# Patient Record
Sex: Female | Born: 1945
Health system: Southern US, Community
[De-identification: ages and names within clinical notes are randomized; demographics above are authoritative.]

## PROBLEM LIST (undated history)

## (undated) DIAGNOSIS — F909 Attention-deficit hyperactivity disorder, unspecified type: Secondary | ICD-10-CM

## (undated) DIAGNOSIS — M810 Age-related osteoporosis without current pathological fracture: Secondary | ICD-10-CM

## (undated) DIAGNOSIS — E739 Lactose intolerance, unspecified: Secondary | ICD-10-CM

## (undated) DIAGNOSIS — E785 Hyperlipidemia, unspecified: Secondary | ICD-10-CM

## (undated) DIAGNOSIS — J45909 Unspecified asthma, uncomplicated: Secondary | ICD-10-CM

## (undated) DIAGNOSIS — F32A Depression, unspecified: Secondary | ICD-10-CM

## (undated) DIAGNOSIS — T7840XA Allergy, unspecified, initial encounter: Secondary | ICD-10-CM

## (undated) DIAGNOSIS — K219 Gastro-esophageal reflux disease without esophagitis: Secondary | ICD-10-CM

## (undated) DIAGNOSIS — L309 Dermatitis, unspecified: Secondary | ICD-10-CM

## (undated) DIAGNOSIS — Z1379 Encounter for other screening for genetic and chromosomal anomalies: Principal | ICD-10-CM

## (undated) DIAGNOSIS — F419 Anxiety disorder, unspecified: Secondary | ICD-10-CM

## (undated) DIAGNOSIS — Z1371 Encounter for nonprocreative screening for genetic disease carrier status: Secondary | ICD-10-CM

## (undated) DIAGNOSIS — Z8481 Family history of carrier of genetic disease: Secondary | ICD-10-CM

## (undated) HISTORY — DX: Attention-deficit hyperactivity disorder, unspecified type: F90.9

## (undated) HISTORY — PX: OTHER SURGICAL HISTORY: SHX169

## (undated) HISTORY — DX: Family history of carrier of genetic disease: Z84.81

## (undated) HISTORY — DX: Hyperlipidemia, unspecified: E78.5

## (undated) HISTORY — DX: Depression, unspecified: F32.A

## (undated) HISTORY — DX: Age-related osteoporosis without current pathological fracture: M81.0

## (undated) HISTORY — DX: Lactose intolerance, unspecified: E73.9

## (undated) HISTORY — DX: Gastro-esophageal reflux disease without esophagitis: K21.9

## (undated) HISTORY — PX: TUBAL LIGATION: SHX77

## (undated) HISTORY — DX: Allergy, unspecified, initial encounter: T78.40XA

## (undated) HISTORY — DX: Dermatitis, unspecified: L30.9

## (undated) HISTORY — DX: Encounter for other screening for genetic and chromosomal anomalies: Z13.79

## (undated) HISTORY — PX: WISDOM TOOTH EXTRACTION: SHX21

## (undated) HISTORY — DX: Unspecified asthma, uncomplicated: J45.909

## (undated) HISTORY — DX: Anxiety disorder, unspecified: F41.9

---

## 1997-12-15 ENCOUNTER — Other Ambulatory Visit: Admission: RE | Admit: 1997-12-15 | Discharge: 1997-12-15 | Payer: Self-pay | Admitting: *Deleted

## 1998-01-30 ENCOUNTER — Ambulatory Visit (HOSPITAL_COMMUNITY): Admission: RE | Admit: 1998-01-30 | Discharge: 1998-01-30 | Payer: Self-pay | Admitting: Obstetrics and Gynecology

## 1999-01-04 ENCOUNTER — Other Ambulatory Visit: Admission: RE | Admit: 1999-01-04 | Discharge: 1999-01-04 | Payer: Self-pay | Admitting: Obstetrics and Gynecology

## 1999-02-05 ENCOUNTER — Encounter: Payer: Self-pay | Admitting: Obstetrics and Gynecology

## 1999-02-05 ENCOUNTER — Ambulatory Visit (HOSPITAL_COMMUNITY): Admission: RE | Admit: 1999-02-05 | Discharge: 1999-02-05 | Payer: Self-pay | Admitting: Obstetrics and Gynecology

## 1999-06-11 DIAGNOSIS — A4902 Methicillin resistant Staphylococcus aureus infection, unspecified site: Secondary | ICD-10-CM

## 1999-06-11 HISTORY — DX: Methicillin resistant Staphylococcus aureus infection, unspecified site: A49.02

## 2000-08-21 ENCOUNTER — Other Ambulatory Visit: Admission: RE | Admit: 2000-08-21 | Discharge: 2000-08-21 | Payer: Self-pay | Admitting: Obstetrics and Gynecology

## 2001-09-17 ENCOUNTER — Other Ambulatory Visit: Admission: RE | Admit: 2001-09-17 | Discharge: 2001-09-17 | Payer: Self-pay | Admitting: Obstetrics and Gynecology

## 2002-11-23 ENCOUNTER — Other Ambulatory Visit: Admission: RE | Admit: 2002-11-23 | Discharge: 2002-11-23 | Payer: Self-pay | Admitting: Obstetrics and Gynecology

## 2003-12-22 ENCOUNTER — Other Ambulatory Visit: Admission: RE | Admit: 2003-12-22 | Discharge: 2003-12-22 | Payer: Self-pay | Admitting: Obstetrics and Gynecology

## 2005-02-06 ENCOUNTER — Other Ambulatory Visit: Admission: RE | Admit: 2005-02-06 | Discharge: 2005-02-06 | Payer: Self-pay | Admitting: Obstetrics and Gynecology

## 2012-01-12 ENCOUNTER — Ambulatory Visit (INDEPENDENT_AMBULATORY_CARE_PROVIDER_SITE_OTHER): Payer: BC Managed Care – PPO | Admitting: Family Medicine

## 2012-01-12 VITALS — BP 126/71 | HR 76 | Temp 97.9°F | Resp 18 | Ht 62.0 in | Wt 100.0 lb

## 2012-01-12 DIAGNOSIS — J329 Chronic sinusitis, unspecified: Secondary | ICD-10-CM

## 2012-01-12 MED ORDER — DOXYCYCLINE HYCLATE 100 MG PO TABS: 100.0000 mg | ORAL_TABLET | Freq: Two times a day (BID) | ORAL | Status: AC

## 2012-01-12 NOTE — Progress Notes (Signed)
[facility] [address] [place] [address] [phone]  Date:  01/12/2012   Name:  Alexis Phillips   DOB:  [birth year]   MRN:  [id number]  PCP:  No primary provider on file.    Chief Complaint: Sinusitis, Neck Pain and Headache   History of Present Illness:  Alexis Phillips is a 66 y.o. very pleasant female patient who presents with the following:  She is here today with illness.  She noted a "cold" and sinus congestion- she continues to have sinus pain and tooth pain.  Her illness started about a week ago.  She had sneezing, coughing, and now has mostly head and chest congestion.  She started to feel better yesterday, but now her neck and head hurt.  She just got a new job and is worried about being out due to illness.   She has noted a temp up to around 100 degrees.  Has noted a few chills, but no aches.  She has felt queasy due to drainage, but nothing serious.    She is generally healthy.    There is no problem list on file for this patient.   No past medical history on file.  No past surgical history on file.  History  Substance Use Topics  . Smoking status: Never Smoker   . Smokeless tobacco: Not on file  . Alcohol Use: Not on file    No family history on file.  Allergies not on file  Medication list has been reviewed and updated.  No current outpatient prescriptions on file prior to visit.    Review of Systems:  As per HPI- otherwise negative.   Physical Examination: Filed Vitals:   01/12/12 1751  BP: 126/71  Pulse: 76  Temp: 97.9 F (36.6 C)  Resp: 18   Filed Vitals:   01/12/12 1751  Height: 5\' 2"  (1.575 m)  Weight: 100 lb (45.36 kg)   Body mass index is 18.29 kg/(m^2). Ideal Body Weight: Weight in (lb) to have BMI = 25: 136.4   GEN: WDWN, NAD, Non-toxic, A & O x 3 HEENT: Atraumatic, Normocephalic. Neck supple. No masses, No LAD.  TM and oropharynx wnl.  Tender over frontal sinuses. No meningismus.  Tender shotty  nodes in her occipital area.   Ears and Nose: No external deformity. CV: RRR, No M/G/R. No JVD. No thrill. No extra heart sounds. PULM: CTA B, no wheezes, crackles, rhonchi. No retractions. No resp. distress. No accessory muscle use. ABD: S, NT, N EXTR: No c/c/e NEURO Normal gait.  PSYCH: Normally interactive. Conversant. Not depressed or anxious appearing.  Calm demeanor.    Assessment and Plan: 1. Sinusitis  doxycycline (VIBRA-TABS) 100 MG tablet   Treat for sinusitis as above.  If not better in a few days please let me know- Sooner if worse.     Abbe Amsterdam, MD

## 2012-01-13 ENCOUNTER — Telehealth: Payer: Self-pay

## 2012-01-13 NOTE — Telephone Encounter (Signed)
PT STATES THE DOXYCYCLINE SHE WAS PUT ON IS TO EXPENSIVE. WOULD LIKE TO KNOW IF WE WOULD CALL [facility] ON [address] AND SEE IF THEY OFFER THE $4.00 MEDICINE PLEASE CALL [phone]   [facility] [address]

## 2012-01-13 NOTE — Telephone Encounter (Signed)
She is at [facility], it is $52 she is advised [facility] another [facility] and see if there is any way this pharmacy has this Rx.

## 2012-03-20 ENCOUNTER — Ambulatory Visit (INDEPENDENT_AMBULATORY_CARE_PROVIDER_SITE_OTHER): Payer: BC Managed Care – PPO

## 2012-03-20 DIAGNOSIS — Z23 Encounter for immunization: Secondary | ICD-10-CM

## 2012-08-27 ENCOUNTER — Other Ambulatory Visit: Payer: Self-pay | Admitting: Obstetrics and Gynecology

## 2012-09-09 ENCOUNTER — Ambulatory Visit
Admission: RE | Admit: 2012-09-09 | Discharge: 2012-09-09 | Disposition: A | Discharge status: Home / Self Care | Payer: Medicare Other | Source: Ambulatory Visit | Attending: Obstetrics and Gynecology | Admitting: Obstetrics and Gynecology

## 2012-09-09 DIAGNOSIS — R928 Other abnormal and inconclusive findings on diagnostic imaging of breast: Secondary | ICD-10-CM

## 2013-03-22 ENCOUNTER — Ambulatory Visit (INDEPENDENT_AMBULATORY_CARE_PROVIDER_SITE_OTHER): Payer: Medicare Other | Admitting: *Deleted

## 2013-03-22 DIAGNOSIS — Z23 Encounter for immunization: Secondary | ICD-10-CM

## 2013-04-29 ENCOUNTER — Ambulatory Visit (INDEPENDENT_AMBULATORY_CARE_PROVIDER_SITE_OTHER): Payer: Medicare Other | Admitting: Internal Medicine

## 2013-04-29 ENCOUNTER — Encounter: Payer: Self-pay | Admitting: Internal Medicine

## 2013-04-29 VITALS — BP 110/70 | HR 72 | Temp 98.6°F | Resp 12

## 2013-04-29 DIAGNOSIS — S61209A Unspecified open wound of unspecified finger without damage to nail, initial encounter: Secondary | ICD-10-CM

## 2013-04-29 DIAGNOSIS — M79609 Pain in unspecified limb: Secondary | ICD-10-CM

## 2013-04-29 DIAGNOSIS — Z23 Encounter for immunization: Secondary | ICD-10-CM

## 2013-04-29 NOTE — Progress Notes (Signed)
  Subjective:    Patient ID: Alexis Phillips, female    DOB: Oct 22, 1945, 67 y.o.   MRN: [id number]  HPI At home and glass broke cutting distal dorsal right 5th finger.No function loss. Clean incision Needs Tdap update, has grand children.  Review of Systems     Objective:   Physical Exam  Constitutional: She is oriented to person, place, and time. She appears well-developed and well-nourished.  HENT:  Head: Normocephalic.  Eyes: EOM are normal.  Pulmonary/Chest: Effort normal.  Musculoskeletal: Normal range of motion. She exhibits tenderness.  Neurological: She is alert and oriented to person, place, and time. She has normal strength. No cranial nerve deficit or sensory deficit. She exhibits normal muscle tone. Coordination normal.  Skin: Laceration noted.     Right 5th dorsal wound Needs sutures  Psychiatric: She has a normal mood and affect.          Assessment & Plan:  Wound care Tdap vac

## 2013-04-29 NOTE — Patient Instructions (Signed)

## 2013-04-29 NOTE — Progress Notes (Signed)
Procedure Note: Verbal consent obtained from the patient.  Digital block with 4 cc Lidocaine 2% without epinephrine.  Wound scrubbed with soap and water.  Wound explored.  No foreign bodies or deep structure injury noted.  Wound closed with #4 simple interrupted sutures of 6-0 ethilon.  Area cleansed and dressed.  Discussed wound care. Pt tolerated very well.

## 2013-05-07 ENCOUNTER — Ambulatory Visit (INDEPENDENT_AMBULATORY_CARE_PROVIDER_SITE_OTHER): Payer: Medicare Other | Admitting: Family Medicine

## 2013-05-07 VITALS — BP 110/70 | HR 68 | Temp 98.0°F | Resp 16 | Wt 103.0 lb

## 2013-05-07 DIAGNOSIS — Z5189 Encounter for other specified aftercare: Secondary | ICD-10-CM

## 2013-05-07 DIAGNOSIS — S61401D Unspecified open wound of right hand, subsequent encounter: Secondary | ICD-10-CM

## 2013-05-07 MED ORDER — CEPHALEXIN 500 MG PO CAPS: 500.0000 mg | ORAL_CAPSULE | Freq: Three times a day (TID) | ORAL | Status: DC

## 2013-05-07 NOTE — Progress Notes (Signed)
Wound recheck  Subjective: Though it is still painful  Objective: 4 sutures removed. There is a little erythema and swelling.  Assessment: Wound of finger, sutures removed, possible infection  Plan: Keflex 500 3 times a day for 5 days Return if worse Keep clean and dry

## 2013-05-07 NOTE — Patient Instructions (Signed)
Take 13 times daily for infection  Keep wound clean and dry  Return if worse.

## 2013-11-16 LAB — HM MAMMOGRAPHY

## 2014-01-03 ENCOUNTER — Ambulatory Visit (INDEPENDENT_AMBULATORY_CARE_PROVIDER_SITE_OTHER): Payer: Medicare HMO | Admitting: Internal Medicine

## 2014-01-03 VITALS — BP 120/80 | HR 68 | Temp 97.8°F | Resp 16 | Ht 62.0 in | Wt 103.5 lb

## 2014-01-03 DIAGNOSIS — J4521 Mild intermittent asthma with (acute) exacerbation: Secondary | ICD-10-CM | POA: Insufficient documentation

## 2014-01-03 DIAGNOSIS — J45901 Unspecified asthma with (acute) exacerbation: Secondary | ICD-10-CM

## 2014-01-03 DIAGNOSIS — J301 Allergic rhinitis due to pollen: Secondary | ICD-10-CM

## 2014-01-03 MED ORDER — ALBUTEROL SULFATE HFA 108 (90 BASE) MCG/ACT IN AERS: 2.0000 | INHALATION_SPRAY | Freq: Four times a day (QID) | RESPIRATORY_TRACT | Status: AC | PRN

## 2014-01-03 MED ORDER — BECLOMETHASONE DIPROPIONATE 40 MCG/ACT IN AERS: 2.0000 | INHALATION_SPRAY | Freq: Two times a day (BID) | RESPIRATORY_TRACT | Status: DC

## 2014-01-03 NOTE — Progress Notes (Signed)
Subjective:  This chart was scribed for Alexis Koyanagi, MD by Alexis Phillips, ED Scribe. The patient was seen in room 3. Patient's care was started at 8:29 PM.   Patient ID: Alexis Phillips, female    DOB: [birth year], 68 y.o.   MRN: [id number]  Chief Complaint  Patient presents with  . Asthma  . Allergies   HPI HPI Comments: Alexis Phillips is a 68 y.o. female, with a h/o environmental allergies and asthma, who presents to the [facility] complaining of intermittent allergies, specifically chest tightness over the past few days. Pt states that once she steps outside she feels chest tightness. Pt reports associated HA, SOB, deep cough at night, postnasal drip, scratchy throat, raspy voice at night. Pt needs a refill on her inhaler. Pt has used an inhaler over the past 1-2 years. Pt has tried Advair in the past with hoarseness as a side effect. She has not tried Zyrtec or Allegra for allergies. Pt is a nonsmoker.   Healthy exerciser otherwise Alexis Phillips No past medical history on file. Current Outpatient Prescriptions on File Prior to Visit  Medication Sig Dispense Refill  . calcium-vitamin D (OSCAL WITH D) 250-125 MG-UNIT per tablet Take 1 tablet by mouth daily.      Marland Kitchen estradiol (ESTRACE) 0.1 MG/GM vaginal cream Place 2 g vaginally daily.       No current facility-administered medications on file prior to visit.   Allergies  Allergen Reactions  . Aspirin     Asthma exacerbation   . Augmentin [Amoxicillin-Pot Clavulanate]     Rash   . Boniva [Ibandronic Acid]     Bone and chest pain  . Codeine     Nausea   . Septra [Sulfamethoxazole-Trimethoprim]     rash   Review of Systems  Constitutional: Negative for fever, fatigue and unexpected weight change.  HENT: Positive for postnasal drip and voice change.   Eyes: Negative for discharge and itching.  Respiratory: Positive for cough, chest tightness and wheezing. Negative for shortness of  breath.   Cardiovascular: Negative for chest pain, palpitations and leg swelling.  Allergic/Immunologic: Positive for environmental allergies.  Neurological: Positive for headaches.      Objective:   Physical Exam  Constitutional: She is oriented to person, place, and time. She appears well-developed and well-nourished.  HENT:  Nose: Nose normal.  Mouth/Throat: Oropharynx is clear and moist.  Eyes: Conjunctivae and EOM are normal. Pupils are equal, round, and reactive to light.  Cardiovascular: Normal rate and regular rhythm.   Pulmonary/Chest: Effort normal and breath sounds normal. No respiratory distress. She has no wheezes.  She says wheez resolved in waiting room  Lymphadenopathy:    She has no cervical adenopathy.  Neurological: She is alert and oriented to person, place, and time.      Assessment & Plan:   I personally performed the services described in this documentation, which was scribed in my presence. The recorded information has been reviewed and is accurate.  Asthma with acute exacerbation, mild intermittent  Allergic rhinitis due to pollen  Meds ordered this encounter  Medications  . beclomethasone (QVAR) 40 MCG/ACT inhaler    Sig: Inhale 2 puffs into the lungs 2 (two) times daily. After 2 weeks may reduce to 1 puff bid    Dispense:  1 Inhaler    Refill:  12  . albuterol (PROVENTIL HFA;VENTOLIN HFA) 108 (90 BASE) MCG/ACT inhaler    Sig: Inhale 2  puffs into the lungs every 6 (six) hours as needed for wheezing or shortness of breath.    Dispense:  1 Inhaler    Refill:  5   OTC antihistamines Add singulair  If not controlled 4weeks

## 2014-03-11 ENCOUNTER — Ambulatory Visit (INDEPENDENT_AMBULATORY_CARE_PROVIDER_SITE_OTHER): Payer: Medicare HMO | Admitting: Radiology

## 2014-03-11 DIAGNOSIS — Z23 Encounter for immunization: Secondary | ICD-10-CM

## 2014-04-18 ENCOUNTER — Encounter: Payer: Self-pay | Admitting: Family Medicine

## 2014-04-18 ENCOUNTER — Ambulatory Visit (INDEPENDENT_AMBULATORY_CARE_PROVIDER_SITE_OTHER): Payer: Medicare HMO | Admitting: Family Medicine

## 2014-04-18 VITALS — BP 127/90 | HR 66 | Temp 97.4°F | Resp 16 | Ht 62.25 in | Wt 101.2 lb

## 2014-04-18 DIAGNOSIS — L259 Unspecified contact dermatitis, unspecified cause: Secondary | ICD-10-CM

## 2014-04-18 MED ORDER — HYDROCORTISONE 2.5 % EX OINT: TOPICAL_OINTMENT | Freq: Two times a day (BID) | CUTANEOUS | Status: DC

## 2014-04-18 MED ORDER — PREDNISONE 20 MG PO TABS: ORAL_TABLET | ORAL | Status: DC

## 2014-04-18 NOTE — Progress Notes (Signed)
Subjective:    Patient ID: Alexis Phillips, female    DOB: [birth year], 68 y.o.   MRN: [id number]  04/18/2014  Hives   HPI This 68 y.o. female presents for evaluation of itchy rash.  Onset several weeks ago.  Started on neck and around scalp and ears.  Extends to back and B arms.  +Scaling.  Worsens at night. Very dry skin chronic.   Itchy skin at baseline.  History of asthma, allergies, eczema.  Has been using scented body wash. Has also been using fragrant detergent to wash clothes.  Has changed shampoos as well lately. Has changed hairsprays hoping to improve rash without improvement.  No palm or sole involvement.  No wheezing; no SOB. No genital involvement or other mucosal involvement. No lower extremity involvement.  Has applied OTC hydrocortisone cream. History of hives several years ago.  Realizes that rash may be due to allergies.  No recent acute illness; no recent sore throat.  No new medications.  Has been taking Benadryl 25mg  two at bedtime.    Review of Systems  Constitutional: Negative for fever, chills, diaphoresis and fatigue.  HENT: Negative for congestion, ear pain, postnasal drip, rhinorrhea, sneezing and sore throat.   Respiratory: Negative for cough and shortness of breath.   Skin: Positive for color change and rash. Negative for pallor and wound.    History reviewed. No pertinent past medical history. History reviewed. No pertinent past surgical history. Allergies  Allergen Reactions  . Aspirin     Asthma exacerbation   . Augmentin [Amoxicillin-Pot Clavulanate]     Rash   . Boniva [Ibandronic Acid]     Bone and chest pain  . Codeine     Nausea   . Septra [Sulfamethoxazole-Trimethoprim]     rash   Current Outpatient Prescriptions  Medication Sig Dispense Refill  . albuterol (PROVENTIL HFA;VENTOLIN HFA) 108 (90 BASE) MCG/ACT inhaler Inhale 2 puffs into the lungs every 6 (six) hours as needed for wheezing or shortness of breath. 1 Inhaler 5  .  calcium-vitamin D (OSCAL WITH D) 250-125 MG-UNIT per tablet Take 1 tablet by mouth daily.    Marland Kitchen estradiol (ESTRACE) 0.1 MG/GM vaginal cream Place 2 g vaginally daily.    . beclomethasone (QVAR) 40 MCG/ACT inhaler Inhale 2 puffs into the lungs 2 (two) times daily. After 2 weeks may reduce to 1 puff bid 1 Inhaler 12  . hydrocortisone 2.5 % ointment Apply topically 2 (two) times daily. 60 g 4  . predniSONE (DELTASONE) 20 MG tablet Three tablets daily x 1 day, then two tablets daily x 5 days, then one tablet daily x 5 days 18 tablet 0   No current facility-administered medications for this visit.       Objective:    BP 127/90 mmHg  Pulse 66  Temp(Src) 97.4 F (36.3 C) (Oral)  Resp 16  Ht 5' 2.25" (1.581 m)  Wt 101 lb 3.2 oz (45.904 kg)  BMI 18.36 kg/m2  SpO2 97% Physical Exam  Constitutional: She is oriented to person, place, and time. She appears well-developed and well-nourished. No distress.  HENT:  Head: Normocephalic and atraumatic.  Mouth/Throat: Oropharynx is clear and moist.  Eyes: Conjunctivae are normal. Pupils are equal, round, and reactive to light.  Neck: Normal range of motion. Neck supple.  Cardiovascular: Normal rate, regular rhythm and normal heart sounds.  Exam reveals no gallop and no friction rub.   No murmur heard. Pulmonary/Chest: Effort normal and breath sounds normal. She has no  wheezes. She has no rales.  Lymphadenopathy:    She has no cervical adenopathy.  Neurological: She is alert and oriented to person, place, and time.  Skin: Rash noted. She is not diaphoretic.  Scattered maculopapular fine rash posterior scalp and posterior neck and upper back; extends to B arms.  No vesicles or pustules; no linear distribution.  Skin very dry.  Faint rash pre-auricular region facial.    Psychiatric: She has a normal mood and affect. Her behavior is normal.  Nursing note and vitals reviewed.  No results found for this or any previous visit.     Assessment & Plan:    1. Contact dermatitis      1. Contact dermatitis: New.  In patient with asthma, eczema.  Rx for Prednisone taper provided; recommend starting Claritin 10mg  daily; continue Benadryl 50mg  qhs. Rx for hydrocortisone cream 2.5% provided to use PRN.  Recommend switching to Dove soap for sensitive skin, detergent for sensitive skin.  RTC for lack of improvement.  Recommend regular lotion to skin for dryness.    Meds ordered this encounter  Medications  . predniSONE (DELTASONE) 20 MG tablet    Sig: Three tablets daily x 1 day, then two tablets daily x 5 days, then one tablet daily x 5 days    Dispense:  18 tablet    Refill:  0  . hydrocortisone 2.5 % ointment    Sig: Apply topically 2 (two) times daily.    Dispense:  60 g    Refill:  4    No Follow-up on file.    Reginia Forts, M.D.  [facility] [address] [place], [place]  [address] [phone] phone [phone] fax

## 2014-04-18 NOTE — Patient Instructions (Signed)
1. Take Benadryl two tablets at bedtime. 2.  Take Claritin 10mg  one tablet daily for two weeks. 3.  Switch to Dove soap for sensitive skin. 4. Switch to detergent for sensitive skin.

## 2014-04-19 ENCOUNTER — Encounter: Payer: Self-pay | Admitting: Family Medicine

## 2014-05-09 ENCOUNTER — Telehealth: Payer: Self-pay

## 2014-05-09 NOTE — Telephone Encounter (Signed)
Pt advised to RTC

## 2014-05-09 NOTE — Telephone Encounter (Signed)
Pt states that she was in on 04/18/14 for hives, the hives have returned pt would like to know if she should come in or if she could have a refill on prednisone.   [place]  Best# [phone]

## 2014-05-11 ENCOUNTER — Telehealth: Payer: Self-pay

## 2014-05-11 ENCOUNTER — Ambulatory Visit (INDEPENDENT_AMBULATORY_CARE_PROVIDER_SITE_OTHER): Payer: Medicare HMO | Admitting: Emergency Medicine

## 2014-05-11 VITALS — BP 118/64 | HR 67 | Temp 98.0°F | Resp 17 | Ht 63.0 in | Wt 104.0 lb

## 2014-05-11 DIAGNOSIS — L309 Dermatitis, unspecified: Secondary | ICD-10-CM

## 2014-05-11 MED ORDER — TRIAMCINOLONE ACETONIDE 0.5 % EX CREA: TOPICAL_CREAM | CUTANEOUS | Status: DC

## 2014-05-11 MED ORDER — TRIAMCINOLONE 0.1 % CREAM:EUCERIN CREAM 1:1: 1.0000 "application " | TOPICAL_CREAM | Freq: Three times a day (TID) | CUTANEOUS | Status: DC

## 2014-05-11 NOTE — Telephone Encounter (Signed)
1 lb jar prepared based on prescription, $94. Triamcinolone 0.1% cream, 60 g tube, is covered by her plan with a $10 copay.  Advise the patient I have sent in JUST the steroid cream. She needs to mix it herself with Eucerin cream and apply.  Meds ordered this encounter  Medications  . triamcinolone cream (KENALOG) 0.5 %    Sig: Mix a small amount in the palm with the moisturizer of choice (advise Eucerin)    Dispense:  60 g    Refill:  2    Order Specific Question:  Supervising Provider    Answer:  DOOLITTLE, ROBERT P [[id number]]

## 2014-05-11 NOTE — Progress Notes (Signed)
Subjective:  This chart was scribed for Alexis Lipps A. Everlene Farrier, MD by Alexis Phillips, Medical scribe. This patient was seen in ROOM 3 and the patient's care was started 8:15 AM.   Patient ID: Alexis Phillips, female    DOB: [birth year], 68 y.o.   MRN: [id number]   Chief Complaint  Patient presents with  . Rash    All over body of unknown origin    Rash Pertinent negatives include no shortness of breath.   HPI Comments: Alexis Phillips is a 68 y.o. female with a history of eczema and asthma who presents to [facility] complaining of an intermittent rash for the last 3 months. Pt says the rash starts on her neck and spreads to her back and bra line. She states that she has had bad seasonal allergies this year and states she is typically very sensitive to weather changes. She says the rash typically worsens as the days goes on. Pt says she has changed her shampoo and soap in attempts to resolve the rash. She states it is not present on her arms or legs. Pt states she currently takes Claritin, 2 benadryl before she goes to bed and hydrocortisone cream but the rash is still present. She denies any airway symptoms.    Patient Active Problem List   Diagnosis Date Noted  . Asthma with acute exacerbation 01/03/2014  . Allergic rhinitis due to pollen 01/03/2014   Past Medical History  Diagnosis Date  . Allergy   . Asthma   . Eczema    Current Outpatient Prescriptions on File Prior to Visit  Medication Sig Dispense Refill  . albuterol (PROVENTIL HFA;VENTOLIN HFA) 108 (90 BASE) MCG/ACT inhaler Inhale 2 puffs into the lungs every 6 (six) hours as needed for wheezing or shortness of breath. 1 Inhaler 5  . calcium-vitamin D (OSCAL WITH D) 250-125 MG-UNIT per tablet Take 1 tablet by mouth daily.    Marland Kitchen estradiol (ESTRACE) 0.1 MG/GM vaginal cream Place 2 g vaginally daily.    . hydrocortisone 2.5 % ointment Apply topically 2 (two) times daily. 60 g 4  . predniSONE (DELTASONE) 20 MG tablet Three tablets daily x 1  day, then two tablets daily x 5 days, then one tablet daily x 5 days (Patient not taking: Reported on 05/11/2014) 18 tablet 0   No current facility-administered medications on file prior to visit.   Allergies  Allergen Reactions  . Aspirin     Asthma exacerbation   . Augmentin [Amoxicillin-Pot Clavulanate]     Rash   . Boniva [Ibandronic Acid]     Bone and chest pain  . Codeine     Nausea   . Septra [Sulfamethoxazole-Trimethoprim]     rash   No past surgical history on file.  No family history on file.   Review of Systems  Respiratory: Negative for shortness of breath.   Skin: Positive for color change and rash.      Objective:  Physical Exam  Constitutional: She is oriented to person, place, and time. She appears well-developed and well-nourished.  HENT:  Head: Normocephalic and atraumatic.  Neck: Neck supple. No tracheal deviation present.  Cardiovascular: Normal rate, regular rhythm and normal heart sounds.   Pulmonary/Chest: Effort normal and breath sounds normal. No respiratory distress. She has no wheezes. She has no rales.  Abdominal: She exhibits no distension.  Neurological: She is alert and oriented to person, place, and time.  Skin: Skin is warm and dry.  Macular papular rash that involves  neck, back, and anterior areas beneath the breasts. Spotty areas on the abdomen.   Psychiatric: She has a normal mood and affect. Her behavior is normal.  Nursing note and vitals reviewed.   Filed Vitals:   05/11/14 0816  BP: 118/64  Pulse: 67  Temp: 98 F (36.7 C)  TempSrc: Oral  Resp: 17  Height: 5\' 3"  (1.6 m)  Weight: 104 lb (47.174 kg)  SpO2: 99%      Assessment & Plan:  . Referral has been made to dermatology. She will take  AM  Zyrtec in the morning and use triamcinolone Eucerin mix twice a day. I personally performed the services described in this documentation, which was scribed in my presence. The recorded information has been reviewed and is accurate.I  personally performed the services described in this documentation, which was scribed in my presence. The recorded information has been reviewed and is accurate.

## 2014-05-11 NOTE — Telephone Encounter (Signed)
PT states that the Triamcinolone Acetonide (TRIAMCINOLONE 0.1 % CREAM : EUCERIN) CREA [[id number]] that she was prescribed today was too expensive, she would like to know if there is something else cheaper she can use until she has er appt, with the dermatologist.

## 2014-05-11 NOTE — Progress Notes (Signed)
Subjective:  This chart was scribed for Alexis Phillips A. Alexis Farrier, MD by [name], Medical scribe. This patient was seen in ROOM 3 and the patient's care was started 8:15 AM.   Patient ID: Alexis Phillips, female    DOB: 1945-12-07, 68 y.o.   MRN: [id number]   Chief Complaint  Patient presents with   Rash    All over body of unknown origin    Rash Pertinent negatives include no shortness of breath.   HPI Comments: SYNA GAD is a 68 y.o. female with a history of eczema and asthma who presents to [facility] complaining of an intermittent rash for the last 3 months. Pt says the rash starts on her neck and spreads to her back and bra line. She states that she has had bad seasonal allergies this year and states she is typically very sensitive to weather changes. She says the rash typically worsens as the days goes on. Pt says she has changed her shampoo and soap in attempts to resolve the rash. She states it is not present on her arms or legs. Pt states she currently takes Claritin, 2 benadryl before she goes to bed and hydrocortisone cream but the rash is still present. She denies any airway symptoms.    Patient Active Problem List   Diagnosis Date Noted   Asthma with acute exacerbation 01/03/2014   Allergic rhinitis due to pollen 01/03/2014   Past Medical History  Diagnosis Date   Allergy    Asthma    Eczema    Current Outpatient Prescriptions on File Prior to Visit  Medication Sig Dispense Refill   albuterol (PROVENTIL HFA;VENTOLIN HFA) 108 (90 BASE) MCG/ACT inhaler Inhale 2 puffs into the lungs every 6 (six) hours as needed for wheezing or shortness of breath. 1 Inhaler 5   calcium-vitamin D (OSCAL WITH D) 250-125 MG-UNIT per tablet Take 1 tablet by mouth daily.     estradiol (ESTRACE) 0.1 MG/GM vaginal cream Place 2 g vaginally daily.     hydrocortisone 2.5 % ointment Apply topically 2 (two) times daily. 60 g 4   predniSONE (DELTASONE) 20 MG tablet Three tablets daily x 1  day, then two tablets daily x 5 days, then one tablet daily x 5 days (Patient not taking: Reported on 05/11/2014) 18 tablet 0   No current facility-administered medications on file prior to visit.   Allergies  Allergen Reactions   Aspirin     Asthma exacerbation    Augmentin [Amoxicillin-Pot Clavulanate]     Rash    Boniva [Ibandronic Acid]     Bone and chest pain   Codeine     Nausea    Septra [Sulfamethoxazole-Trimethoprim]     rash   No past surgical history on file.  No family history on file.   Review of Systems  Respiratory: Negative for shortness of breath.   Skin: Positive for color change and rash.      Objective:  Physical Exam  Constitutional: She is oriented to person, place, and time. She appears well-developed and well-nourished.  HENT:  Head: Normocephalic and atraumatic.  Neck: Neck supple. No tracheal deviation present.  Cardiovascular: Normal rate, regular rhythm and normal heart sounds.   Pulmonary/Chest: Effort normal and breath sounds normal. No respiratory distress. She has no wheezes. She has no rales.  Abdominal: She exhibits no distension.  Neurological: She is alert and oriented to person, place, and time.  Skin: Skin is warm and dry.  Macular papular rash that involves  neck, back, and anterior areas beneath the breasts. Spotty areas on the abdomen.   Psychiatric: She has a normal mood and affect. Her behavior is normal.  Nursing note and vitals reviewed.   Filed Vitals:   05/11/14 0816  BP: 118/64  Pulse: 67  Temp: 98 F (36.7 C)  TempSrc: Oral  Resp: 17  Height: 5\' 3"  (1.6 m)  Weight: 104 lb (47.174 kg)  SpO2: 99%      Assessment & Plan:  . Referral has been made to dermatology. She will take  AM  Zyrtec in the morning and use triamcinolone Eucerin mix twice a day. I personally performed the services described in this documentation, which was scribed in my presence. The recorded information has been reviewed and is  accurate.

## 2014-05-11 NOTE — Patient Instructions (Signed)
Eczema Eczema, also called atopic dermatitis, is a skin disorder that causes inflammation of the skin. It causes a red rash and dry, scaly skin. The skin becomes very itchy. Eczema is generally worse during the cooler winter months and often improves with the warmth of summer. Eczema usually starts showing signs in infancy. Some children outgrow eczema, but it may last through adulthood.  CAUSES  The exact cause of eczema is not known, but it appears to run in families. People with eczema often have a family history of eczema, allergies, asthma, or hay fever. Eczema is not contagious. Flare-ups of the condition may be caused by:   Contact with something you are sensitive or allergic to.   Stress. SIGNS AND SYMPTOMS  Dry, scaly skin.   Red, itchy rash.   Itchiness. This may occur before the skin rash and may be very intense.  DIAGNOSIS  The diagnosis of eczema is usually made based on symptoms and medical history. TREATMENT  Eczema cannot be cured, but symptoms usually can be controlled with treatment and other strategies. A treatment plan might include:  Controlling the itching and scratching.   Use over-the-counter antihistamines as directed for itching. This is especially useful at night when the itching tends to be worse.   Use over-the-counter steroid creams as directed for itching.   Avoid scratching. Scratching makes the rash and itching worse. It may also result in a skin infection (impetigo) due to a break in the skin caused by scratching.   Keeping the skin well moisturized with creams every day. This will seal in moisture and help prevent dryness. Lotions that contain alcohol and water should be avoided because they can dry the skin.   Limiting exposure to things that you are sensitive or allergic to (allergens).   Recognizing situations that cause stress.   Developing a plan to manage stress.  HOME CARE INSTRUCTIONS   Only take over-the-counter or  prescription medicines as directed by your health care provider.   Do not use anything on the skin without checking with your health care provider.   Keep baths or showers short (5 minutes) in warm (not hot) water. Use mild cleansers for bathing. These should be unscented. You may add nonperfumed bath oil to the bath water. It is best to avoid soap and bubble bath.   Immediately after a bath or shower, when the skin is still damp, apply a moisturizing ointment to the entire body. This ointment should be a petroleum ointment. This will seal in moisture and help prevent dryness. The thicker the ointment, the better. These should be unscented.   Keep fingernails cut short. Children with eczema may need to wear soft gloves or mittens at night after applying an ointment.   Dress in clothes made of cotton or cotton blends. Dress lightly, because heat increases itching.   A child with eczema should stay away from anyone with fever blisters or cold sores. The virus that causes fever blisters (herpes simplex) can cause a serious skin infection in children with eczema. SEEK MEDICAL CARE IF:   Your itching interferes with sleep.   Your rash gets worse or is not better within 1 week after starting treatment.   You see pus or soft yellow scabs in the rash area.   You have a fever.   You have a rash flare-up after contact with someone who has fever blisters.  Document Released: 05/24/2000 Document Revised: 03/17/2013 Document Reviewed: 12/28/2012 ExitCare Patient Information 2015 [employer]. This information   is not intended to replace advice given to you by your health care provider. Make sure you discuss any questions you have with your health care provider.  

## 2014-05-12 NOTE — Telephone Encounter (Signed)
Lm for pt advising alternative sent to the pharmacy. Rtn call if any questions.

## 2014-05-12 NOTE — Telephone Encounter (Signed)
Thank you Chelle

## 2014-09-23 ENCOUNTER — Encounter (HOSPITAL_COMMUNITY): Payer: Self-pay | Admitting: Emergency Medicine

## 2014-09-23 ENCOUNTER — Emergency Department (HOSPITAL_COMMUNITY)
Admission: EM | Admit: 2014-09-23 | Discharge: 2014-09-24 | Disposition: A | Discharge status: Home / Self Care | Payer: Medicare HMO | Attending: Emergency Medicine | Admitting: Emergency Medicine

## 2014-09-23 DIAGNOSIS — R109 Unspecified abdominal pain: Secondary | ICD-10-CM | POA: Diagnosis present

## 2014-09-23 DIAGNOSIS — Z872 Personal history of diseases of the skin and subcutaneous tissue: Secondary | ICD-10-CM | POA: Insufficient documentation

## 2014-09-23 DIAGNOSIS — R1084 Generalized abdominal pain: Secondary | ICD-10-CM | POA: Diagnosis not present

## 2014-09-23 DIAGNOSIS — Z7952 Long term (current) use of systemic steroids: Secondary | ICD-10-CM | POA: Insufficient documentation

## 2014-09-23 DIAGNOSIS — Z79899 Other long term (current) drug therapy: Secondary | ICD-10-CM | POA: Insufficient documentation

## 2014-09-23 DIAGNOSIS — J45909 Unspecified asthma, uncomplicated: Secondary | ICD-10-CM | POA: Insufficient documentation

## 2014-09-23 NOTE — ED Notes (Signed)
Bed: WA03 Expected date:  Expected time:  Means of arrival:  Comments: Triage 2

## 2014-09-23 NOTE — ED Notes (Signed)
Pt reports she began having epigastric pain since this morning. Pt states its a burning/aching pain that has gotten worse throughout the day.

## 2014-09-24 ENCOUNTER — Emergency Department (HOSPITAL_COMMUNITY): Payer: Medicare HMO

## 2014-09-24 LAB — CBC
HCT: 41.6 % (ref 36.0–46.0)
Hemoglobin: 13.7 g/dL (ref 12.0–15.0)
MCH: 32.1 pg (ref 26.0–34.0)
MCHC: 32.9 g/dL (ref 30.0–36.0)
MCV: 97.4 fL (ref 78.0–100.0)
PLATELETS: 229 10*3/uL (ref 150–400)
RBC: 4.27 MIL/uL (ref 3.87–5.11)
RDW: 12.9 % (ref 11.5–15.5)
WBC: 6.3 10*3/uL (ref 4.0–10.5)

## 2014-09-24 LAB — COMPREHENSIVE METABOLIC PANEL
ALT: 22 U/L (ref 0–35)
AST: 22 U/L (ref 0–37)
Albumin: 4.1 g/dL (ref 3.5–5.2)
Alkaline Phosphatase: 56 U/L (ref 39–117)
Anion gap: 4 — ABNORMAL LOW (ref 5–15)
BUN: 25 mg/dL — ABNORMAL HIGH (ref 6–23)
CHLORIDE: 106 mmol/L (ref 96–112)
CO2: 30 mmol/L (ref 19–32)
CREATININE: 0.58 mg/dL (ref 0.50–1.10)
Calcium: 8.9 mg/dL (ref 8.4–10.5)
GFR calc Af Amer: >90 mL/min (ref >90)
GFR calc non Af Amer: >90 mL/min (ref >90)
Glucose, Bld: 92 mg/dL (ref 70–99)
Potassium: 3.7 mmol/L (ref 3.5–5.1)
SODIUM: 140 mmol/L (ref 135–145)
TOTAL PROTEIN: 6.5 g/dL (ref 6.0–8.3)
Total Bilirubin: 0.5 mg/dL (ref 0.3–1.2)

## 2014-09-24 LAB — URINALYSIS, ROUTINE W REFLEX MICROSCOPIC
Bilirubin Urine: NEGATIVE
GLUCOSE, UA: NEGATIVE mg/dL
HGB URINE DIPSTICK: NEGATIVE
Ketones, ur: NEGATIVE mg/dL
Leukocytes, UA: NEGATIVE
NITRITE: NEGATIVE
Protein, ur: NEGATIVE mg/dL
Specific Gravity, Urine: 1.015 (ref 1.005–1.030)
Urobilinogen, UA: 0.2 mg/dL (ref 0.0–1.0)
pH: 6 (ref 5.0–8.0)

## 2014-09-24 LAB — LIPASE, BLOOD: LIPASE: 30 U/L (ref 11–59)

## 2014-09-24 MED ORDER — IOHEXOL 300 MG/ML  SOLN
100.0000 mL | Freq: Once | INTRAMUSCULAR | Status: AC | PRN
  Administered 2014-09-24: 100 mL via INTRAVENOUS

## 2014-09-24 MED ORDER — GI COCKTAIL ~~LOC~~
30.0000 mL | Freq: Once | ORAL | Status: AC
  Administered 2014-09-24: 30 mL via ORAL
  Filled 2014-09-24: qty 30

## 2014-09-24 MED ORDER — IOHEXOL 300 MG/ML  SOLN
50.0000 mL | Freq: Once | INTRAMUSCULAR | Status: AC | PRN
  Administered 2014-09-24: 50 mL via ORAL

## 2014-09-24 NOTE — ED Provider Notes (Signed)
CSN: [id number]     Arrival date & time 09/23/14  2153 History   First MD Initiated Contact with Patient 09/23/14 2356     Chief Complaint  Patient presents with  . Abdominal Pain     (Consider location/radiation/quality/duration/timing/severity/associated sxs/prior Treatment) Patient is a 69 y.o. female presenting with abdominal pain.  Abdominal Pain Pain location:  Generalized Pain quality: cramping   Pain radiates to:  Does not radiate Pain severity:  Moderate Onset quality:  Gradual Duration:  1 day Timing:  Constant Progression:  Unchanged Chronicity:  New Context comment:  On doxycycline, metrogel, hydroxyzine Relieved by:  Nothing Worsened by:  Movement and palpation Associated symptoms: no anorexia, no constipation, no diarrhea, no dysuria, no fever, no nausea, no shortness of breath, no vaginal bleeding, no vaginal discharge and no vomiting     Past Medical History  Diagnosis Date  . Allergy   . Asthma   . Eczema    History reviewed. No pertinent past surgical history. History reviewed. No pertinent family history. History  Substance Use Topics  . Smoking status: Never Smoker   . Smokeless tobacco: Never Used  . Alcohol Use: Yes     Comment: wine   OB History    No data available     Review of Systems  Constitutional: Negative for fever.  Respiratory: Negative for shortness of breath.   Gastrointestinal: Positive for abdominal pain. Negative for nausea, vomiting, diarrhea, constipation and anorexia.  Genitourinary: Negative for dysuria, vaginal bleeding and vaginal discharge.  All other systems reviewed and are negative.     Allergies  Aspirin; Augmentin; Boniva; Codeine; and Septra  Home Medications   Prior to Admission medications   Medication Sig Start Date End Date Taking? Authorizing Provider  doxycycline (VIBRA-TABS) 100 MG tablet Take 100 mg by mouth 2 (two) times daily.   Yes Historical Provider, MD  hydrOXYzine (ATARAX/VISTARIL) 25 MG  tablet Take 25 mg by mouth 3 (three) times daily as needed for itching.   Yes Historical Provider, MD  metroNIDAZOLE (METROGEL) 0.75 % gel Apply 1 application topically 2 (two) times daily.   Yes Historical Provider, MD  triamcinolone cream (KENALOG) 0.5 % Mix a small amount in the palm with the moisturizer of choice (advise Eucerin) 05/11/14  Yes Chelle S Jeffery, PA-C  albuterol (PROVENTIL HFA;VENTOLIN HFA) 108 (90 BASE) MCG/ACT inhaler Inhale 2 puffs into the lungs every 6 (six) hours as needed for wheezing or shortness of breath. 01/03/14   Leandrew Koyanagi, MD  calcium-vitamin D (OSCAL WITH D) 250-125 MG-UNIT per tablet Take 1 tablet by mouth daily.    Historical Provider, MD  estradiol (ESTRACE) 0.1 MG/GM vaginal cream Place 1 Applicatorful vaginally 3 (three) times a week.     Historical Provider, MD  hydrocortisone 2.5 % ointment Apply topically 2 (two) times daily. 04/18/14   Wardell Honour, MD  predniSONE (DELTASONE) 20 MG tablet Three tablets daily x 1 day, then two tablets daily x 5 days, then one tablet daily x 5 days Patient not taking: Reported on 05/11/2014 04/18/14   Wardell Honour, MD   BP 124/72 mmHg  Pulse 72  Temp(Src) 98.8 F (37.1 C) (Oral)  Resp 17  SpO2 98% Physical Exam  Constitutional: She is oriented to person, place, and time. She appears well-developed and well-nourished.  HENT:  Head: Normocephalic and atraumatic.  Right Ear: External ear normal.  Left Ear: External ear normal.  Eyes: Conjunctivae and EOM are normal. Pupils are equal, round, and  reactive to light.  Neck: Normal range of motion. Neck supple.  Cardiovascular: Normal rate, regular rhythm, normal heart sounds and intact distal pulses.   Pulmonary/Chest: Effort normal and breath sounds normal.  Abdominal: Soft. Bowel sounds are normal. There is tenderness in the left lower quadrant.  Musculoskeletal: Normal range of motion.  Neurological: She is alert and oriented to person, place, and time.  Skin:  Skin is warm and dry.  Vitals reviewed.   ED Course  Procedures (including critical care time) Labs Review Labs Reviewed  COMPREHENSIVE METABOLIC PANEL - Abnormal; Notable for the following:    BUN 25 (*)    Anion gap 4 (*)    All other components within normal limits  URINALYSIS, ROUTINE W REFLEX MICROSCOPIC  CBC  LIPASE, BLOOD    Imaging Review Ct Abdomen Pelvis W Contrast  09/24/2014   CLINICAL DATA:  Initial evaluation for increased acute epigastric pain.  EXAM: CT ABDOMEN AND PELVIS WITH CONTRAST  TECHNIQUE: Multidetector CT imaging of the abdomen and pelvis was performed using the standard protocol following bolus administration of intravenous contrast.  CONTRAST:  49mL OMNIPAQUE IOHEXOL 300 MG/ML SOLN, 179mL OMNIPAQUE IOHEXOL 300 MG/ML SOLN  COMPARISON:  None.  FINDINGS: The visualized lung bases are clear. Minimal subsegmental atelectasis noted dependently.  Subcentimeter hypodense lesion within the right hepatic lobe noted, too small the characterize, but may reflect a small cyst. Wedge-shaped hyperdense lesion within the subcapsular left hepatic lobe on early arterial phase face to isodense tibial on later delayed phase, likely focal AV shunt. Liver otherwise unremarkable. Gallbladder within normal limits. No biliary dilatation. Scattered hypodense lesions noted within the spleen on earlier phase MN which are not seen on delayed eminences, indeterminate, but of doubtful clinical significance. Adrenal glands and pancreas within normal limits.  Kidneys are equal in size with symmetric enhancement. No nephrolithiasis, hydronephrosis, or focal enhancing renal mass.  Stomach within normal limits. No evidence for bowel obstruction. No abnormal wall thickening, mucosal enhancement, or inflammatory fat stranding seen about the bowels. Appendix within normal limits.  Bladder within normal limits. Septate versus bicornuate uterus noted. Ovaries unremarkable.  No free air or fluid. No  pathologically enlarged intra-abdominal pelvic lymph nodes. Normal intravascular enhancement seen throughout the intra-abdominal aorta. No aneurysm.  No acute osseous abnormality. There is 3 mm anterolisthesis of L4 on L5. Advanced degenerative disc disease and facet arthrosis present at L4-5 and L5-S1. No worrisome lytic or blastic osseous lesion.  IMPRESSION: No acute intra-abdominal or pelvic process identified.   Electronically Signed   By: Jeannine Boga M.D.   On: 09/24/2014 02:40     EKG Interpretation   Date/Time:  Friday September 23 2014 22:34:00 EDT Ventricular Rate:  68 PR Interval:  179 QRS Duration: 69 QT Interval:  376 QTC Calculation: 400 R Axis:   72 Text Interpretation:  Sinus rhythm RSR' in V1 or V2, probably normal  variant Baseline wander in lead(s) II III aVF No old tracing to compare  Confirmed by Debby Freiberg [phone]) on 09/24/2014 12:28:01 AM      MDM   Final diagnoses:  Abdominal pain, acute    69 y.o. female with pertinent PMH of eczema, asthma presents with abd pain as above.  Exam with generalized abd tenderness, LLQ> elsewhere.  Wu as above unremarkable.  Likely medication reaction to steroid and abx.  Dc home in stable condition.  I have reviewed all laboratory and imaging studies if ordered as above  1. Abdominal pain, acute  Debby Freiberg, MD 09/24/14 [phone]

## 2014-09-24 NOTE — ED Notes (Signed)
Awake. Verbally responsive. Resp even and unlabored. No audible adventitious breath sounds noted. ABC's intact. Abd soft/nondistended but tender to palpate. BS (+) and active x4 quadrants. No N/V/D reported. 

## 2014-09-24 NOTE — Discharge Instructions (Signed)

## 2014-11-03 ENCOUNTER — Telehealth: Payer: Self-pay | Admitting: *Deleted

## 2014-11-03 NOTE — Telephone Encounter (Signed)
Phoned the [facility] and spoke with [facility] - has nothing on file any more recent than what we have on file.  States patient usually gets her mammo's through her OB/gyn TEFL [facility] [employer] [place]).  Is looking to see if she has any further data on file. 12/02/2013 mammo results are being faxed to me.  Will update health maintenance once received.

## 2014-11-09 NOTE — Telephone Encounter (Signed)
Called the [facility] for fax status and spoke with Shirlean Mylar in med rec.  Has no clue who I spoke with last week, but furthermore, has no recent documentation.  Phoned [facility] & spoke with Levada Dy in med rec & she is faxing the 12/02/13.  Will update health maintenance & gyn once received.

## 2014-11-09 NOTE — Telephone Encounter (Signed)
Mammogram documentation received from [facility]6/15/2015...no mammographic evidence of malignancy  Performed at [facility] by Dr. Luberta Robertson.  Health maintenance and care team updated & mammo abstracted as well.

## 2015-05-10 DIAGNOSIS — Z681 Body mass index (BMI) 19 or less, adult: Secondary | ICD-10-CM | POA: Diagnosis not present

## 2015-05-10 DIAGNOSIS — M810 Age-related osteoporosis without current pathological fracture: Secondary | ICD-10-CM | POA: Diagnosis not present

## 2015-05-10 DIAGNOSIS — Z1389 Encounter for screening for other disorder: Secondary | ICD-10-CM | POA: Diagnosis not present

## 2015-06-01 DIAGNOSIS — M810 Age-related osteoporosis without current pathological fracture: Secondary | ICD-10-CM | POA: Diagnosis not present

## 2015-06-01 DIAGNOSIS — Z0000 Encounter for general adult medical examination without abnormal findings: Secondary | ICD-10-CM | POA: Diagnosis not present

## 2015-06-08 DIAGNOSIS — Z681 Body mass index (BMI) 19 or less, adult: Secondary | ICD-10-CM | POA: Diagnosis not present

## 2015-06-08 DIAGNOSIS — Z23 Encounter for immunization: Secondary | ICD-10-CM | POA: Diagnosis not present

## 2015-06-08 DIAGNOSIS — Z0000 Encounter for general adult medical examination without abnormal findings: Secondary | ICD-10-CM | POA: Diagnosis not present

## 2015-11-21 DIAGNOSIS — J45909 Unspecified asthma, uncomplicated: Secondary | ICD-10-CM | POA: Diagnosis not present

## 2015-11-21 DIAGNOSIS — J309 Allergic rhinitis, unspecified: Secondary | ICD-10-CM | POA: Diagnosis not present

## 2016-01-17 DIAGNOSIS — Z681 Body mass index (BMI) 19 or less, adult: Secondary | ICD-10-CM | POA: Diagnosis not present

## 2016-01-17 DIAGNOSIS — Z1231 Encounter for screening mammogram for malignant neoplasm of breast: Secondary | ICD-10-CM | POA: Diagnosis not present

## 2016-01-17 DIAGNOSIS — Z124 Encounter for screening for malignant neoplasm of cervix: Secondary | ICD-10-CM | POA: Diagnosis not present

## 2016-01-17 DIAGNOSIS — Z1212 Encounter for screening for malignant neoplasm of rectum: Secondary | ICD-10-CM | POA: Diagnosis not present

## 2016-02-08 DIAGNOSIS — N958 Other specified menopausal and perimenopausal disorders: Secondary | ICD-10-CM | POA: Diagnosis not present

## 2016-02-08 DIAGNOSIS — M816 Localized osteoporosis [Lequesne]: Secondary | ICD-10-CM | POA: Diagnosis not present

## 2016-02-21 DIAGNOSIS — M810 Age-related osteoporosis without current pathological fracture: Secondary | ICD-10-CM | POA: Diagnosis not present

## 2016-02-21 DIAGNOSIS — Z1329 Encounter for screening for other suspected endocrine disorder: Secondary | ICD-10-CM | POA: Diagnosis not present

## 2016-02-21 DIAGNOSIS — M818 Other osteoporosis without current pathological fracture: Secondary | ICD-10-CM | POA: Diagnosis not present

## 2016-06-07 DIAGNOSIS — M810 Age-related osteoporosis without current pathological fracture: Secondary | ICD-10-CM | POA: Diagnosis not present

## 2016-06-07 DIAGNOSIS — Z0000 Encounter for general adult medical examination without abnormal findings: Secondary | ICD-10-CM | POA: Diagnosis not present

## 2016-06-13 DIAGNOSIS — Z681 Body mass index (BMI) 19 or less, adult: Secondary | ICD-10-CM | POA: Diagnosis not present

## 2016-06-13 DIAGNOSIS — Z23 Encounter for immunization: Secondary | ICD-10-CM | POA: Diagnosis not present

## 2016-06-13 DIAGNOSIS — J45909 Unspecified asthma, uncomplicated: Secondary | ICD-10-CM | POA: Diagnosis not present

## 2016-06-13 DIAGNOSIS — F419 Anxiety disorder, unspecified: Secondary | ICD-10-CM | POA: Diagnosis not present

## 2016-06-13 DIAGNOSIS — Z8481 Family history of carrier of genetic disease: Secondary | ICD-10-CM | POA: Diagnosis not present

## 2016-06-13 DIAGNOSIS — M810 Age-related osteoporosis without current pathological fracture: Secondary | ICD-10-CM | POA: Diagnosis not present

## 2016-06-13 DIAGNOSIS — Z0000 Encounter for general adult medical examination without abnormal findings: Secondary | ICD-10-CM | POA: Diagnosis not present

## 2016-06-14 ENCOUNTER — Encounter: Payer: Self-pay | Admitting: Gastroenterology

## 2016-07-04 ENCOUNTER — Encounter: Payer: Self-pay | Admitting: Genetic Counselor

## 2016-07-04 ENCOUNTER — Ambulatory Visit (HOSPITAL_BASED_OUTPATIENT_CLINIC_OR_DEPARTMENT_OTHER): Payer: Medicare HMO | Admitting: Genetic Counselor

## 2016-07-04 ENCOUNTER — Other Ambulatory Visit: Payer: Medicare HMO

## 2016-07-04 DIAGNOSIS — Z315 Encounter for genetic counseling: Secondary | ICD-10-CM

## 2016-07-04 DIAGNOSIS — Z853 Personal history of malignant neoplasm of breast: Secondary | ICD-10-CM | POA: Diagnosis not present

## 2016-07-04 DIAGNOSIS — Z8481 Family history of carrier of genetic disease: Secondary | ICD-10-CM | POA: Diagnosis not present

## 2016-07-04 DIAGNOSIS — Z809 Family history of malignant neoplasm, unspecified: Secondary | ICD-10-CM | POA: Diagnosis not present

## 2016-07-04 DIAGNOSIS — Z803 Family history of malignant neoplasm of breast: Secondary | ICD-10-CM

## 2016-07-04 NOTE — Progress Notes (Signed)
[facility]   Patient Name: Alexis Phillips Patient DOB: Apr 24, 1946 Encounter Date: 07/04/2016  Primary Care Provider: Velna Hatchet, MD  Reason for Visit: Evaluate for hereditary susceptibility to cancer  [name], a 71 y.o. female, is being seen at the [facility] due to a family history of cancer and a pathogenic BRCA1 mutation in her sister and niece. She presents to clinic today to discuss the possibility of a hereditary predisposition to cancer and pursue genetic testing.  History of Present Illness: Alexis Phillips has no personal history of cancer and is in generally good health. She reported having a yearly mammogram, clinical breast exam and gynecologic exam. Her first colonoscopy about 12 years ago was reportedly negative for polyps. She is scheduled for another next month.   Past Medical History:  Diagnosis Date  . Allergy   . Asthma   . Eczema   . Family history of genetic disease carrier    sister with pathogenic BRCA1 mutation    No past surgical history on file.  Social History   Social History  . Marital status: Legally Separated    Spouse name: N/A  . Number of children: N/A  . Years of education: N/A   Social History Main Topics  . Smoking status: Never Smoker  . Smokeless tobacco: Never Used  . Alcohol use Yes     Comment: wine  . Drug use: No  . Sexual activity: Not on file   Other Topics Concern  . Not on file   Social History Narrative  . No narrative on file     Family History:  During the visit, a 4-generation pedigree was obtained. Family tree will be scanned in the Media tab in Epic  Significant diagnoses include the following:  Family History  Problem Relation Age of Onset  . Prostate cancer Father 62    deceased 25  . Breast cancer Sister 79    BRCA1 mutation [id number]  . Cancer Brother     throat ca; deceased 65  . Cancer Paternal Uncle     3  pat uncles; unsure of types of cancers    Additionally, Alexis Phillips has two daughters (ages 67 and 27). Her sister who had breast cancer and this sisters daughter (age 37) both have a BRCA1 mutation ([id number]) identified through [employer]. She has another sister (age 80) and two living brothers (ages 74 and 35) who have not yet undergone genetic testing.  Alexis Phillips's ancestry is Caucasian - NOS. There is no known Jewish ancestry and no consanguinity.  Assessment and Plan: Alexis Phillips is a 71 y.o. female with a family history of breast and other cancers and a BRCA1 mutation in the family. Genetic testing is recommended to determine whether she has this BRCA1 mutation that would impact her cancer screening and risk-reduction options. We reviewed the characteristics, features and inheritance patterns of hereditary cancer syndromes with a focus on the BRCA1 gene. We discussed the process of genetic testing, including insurance coverage and implications of results: positive, negative and Variant of Uncertain Significance. She understood that she technically has a 50% chance of having inherited this mutation, but given that she is cancer-free at 71, it may be that she truly did not inherit it. A negative result will be very reassuring.   Alexis Phillips wished to pursue genetic testing and a blood sample will be sent for analysis of the 43  genes on [facility]'s Common Cancers panel (APC, ATM, AXIN2, BARD1, BMPR1A, BRCA1, BRCA2, BRIP1, CDH1, CDKN2A, CHEK2, DICER1, EPCAM, GREM1, HOXB13, KIT, MEN1, MLH1, MSH2, MSH6, MUTYH, NBN, NF1, PALB2, PDGFRA, PMS2, POLD1, POLE, PTEN, RAD50, RAD51C, RAD51D, SDHA, SDHB, SDHC, SDHD, SMAD4, SMARCA4, STK11, TP53, TSC1, TSC2, VHL). Results should be available in approximately 2-4 weeks, at which point we will contact her and address implications for her as well as address genetic testing for at-risk family members, if needed.    Alexis Phillips is encouraged to remain  in contact with Cancer Genetics annually so that we can update the family history and inform her of any changes in cancer genetics and testing that may be of benefit for this family. Alexis Phillips questions were answered to her satisfaction today and she is welcome to call with any additional questions or concerns. Thank you for the referral and allowing [place] to share in the care of your patient.   Dr. Jana Hakim was available for questions concerning this case. Total time spent by Alexis Berg, MS, CGC in face-to-face counseling was approximately 35 minutes.   Alexis Berg, MS, [place] Certified Genetic Counselor phone: [phone] [email or web address]   ______________________________________________________________________ For Office Staff:  Number of people involved in session: 1 Was an Intern/ student involved with case: no

## 2016-07-16 DIAGNOSIS — Z1212 Encounter for screening for malignant neoplasm of rectum: Secondary | ICD-10-CM | POA: Diagnosis not present

## 2016-07-24 ENCOUNTER — Encounter: Payer: Self-pay | Admitting: Genetic Counselor

## 2016-07-24 ENCOUNTER — Ambulatory Visit: Payer: Self-pay | Admitting: Genetic Counselor

## 2016-07-24 DIAGNOSIS — Z1379 Encounter for other screening for genetic and chromosomal anomalies: Secondary | ICD-10-CM

## 2016-07-24 HISTORY — DX: Encounter for other screening for genetic and chromosomal anomalies: Z13.79

## 2016-07-24 NOTE — Progress Notes (Signed)
[facility]    Patient Name: Alexis Phillips Patient DOB: 1945-10-16 Patient Age: 71 y.o. Encounter Date: 07/24/2016  Primary Care Provider: Velna Hatchet, MD   [name] was called today to discuss genetic test results. Please see the Genetics note from her visit on 07/04/16 for a detailed discussion of her personal and family history.  Genetic Testing: At the time of [name]'s visit, we recommended she pursue genetic testing of multiple associated with a hereditary predisposition to cancer. Testing included sequencing and deletion/duplication analysis. Testing was normal and did not reveal a mutation in these genes, including confirming that she does not have the familial BRCA1 mutation. A copy of the genetic test report will be scanned into Epic under the media tab.  We call this result a true negative result because the cancer-causing mutation was identified in [name]'s family, and she did not inherit it. Given this negative result, [name]'s chances of developing BRCA1-related cancers are the same as they are in the general population.    The genes tested were the 43 genes on [facility]'s Common Cancers panel (APC, ATM, AXIN2, BARD1, BMPR1A, BRCA1, BRCA2, BRIP1, CDH1, CDKN2A, CHEK2, DICER1, EPCAM, GREM1, HOXB13, KIT, MEN1, MLH1, MSH2, MSH6, MUTYH, NBN, NF1, PALB2, PDGFRA, PMS2, POLD1, POLE, PTEN, RAD50, RAD51C, RAD51D, SDHA, SDHB, SDHC, SDHD, SMAD4, SMARCA4, STK11, TP53, TSC1, TSC2, VHL).   Cancer Screening: This normal result is reassuring and indicates that [name] does not likely have an increased risk of cancer due to a mutation in one of these genes and does not have an increased risk of BRCA1-related cancers. We recommended [name] continue to follow the cancer screening guidelines provided by her primary physician.   We discussed undergoing breast screenings that are recommended by the [place] for women in the general population, but that may need to be modified based on other risk factors such as dense breasts, biopsy history or family history.  Breast awareness - Women should be familiar with their breasts and promptly report changes to their healthcare provider.   Starting at age 69: Breast exam, risk assessment, and risk reduction counseling by the provider and mammogram every year. The provider may discuss screening with tomosynthesis.  [name] is also recommended to undergo a yearly gynecologic exam and speak with her GI doctor as to when to have her next colonoscopy.   Family Members: Even though [name] did not inherit the familial gene mutation, others in the family may have. It is very important all of [name]'s relatives (female and female) know of the presence of this mutation and that they may be at increased risk for cancer. Genetic testing can sort out who in your family is at risk and who is not.  Please let us know if we can help facilitate testing. Genetic counselors can be located in other cities, by visiting the website of the [employer] (ArtistMovie.se) and Field seismologist for a Dietitian by zip code.  Lastly, cancer genetics is a rapidly advancing field and it is possible that new genetic tests will be appropriate for her in the future. We encourage her to remain in contact with [place] on an annual basis so we can update her personal and family histories, and let her know of advances in cancer genetics that may benefit the family. Our contact number was provided. [name] is welcome to call anytime with  additional questions.    Steele Berg, MS, [place] Certified Genetic Counselor phone: [phone] [email or web address]

## 2016-07-30 ENCOUNTER — Encounter: Payer: Self-pay | Admitting: Gastroenterology

## 2016-07-30 ENCOUNTER — Ambulatory Visit (AMBULATORY_SURGERY_CENTER): Payer: Self-pay

## 2016-07-30 VITALS — Ht 62.0 in | Wt 102.0 lb

## 2016-07-30 DIAGNOSIS — Z1211 Encounter for screening for malignant neoplasm of colon: Secondary | ICD-10-CM

## 2016-07-30 MED ORDER — SUPREP BOWEL PREP KIT 17.5-3.13-1.6 GM/177ML PO SOLN: 1.0000 | Freq: Once | ORAL | 0 refills | Status: AC

## 2016-07-30 NOTE — Progress Notes (Signed)
No allergies to eggs or soy No past problems with anesthesia No diet med No home oxygen  Declined emmi 

## 2016-08-12 ENCOUNTER — Telehealth: Payer: Self-pay | Admitting: Gastroenterology

## 2016-08-12 NOTE — Telephone Encounter (Signed)
Patient is concerned about her asthma and states that due to the weather change.  She states that it was a little worse yesterday and today is somewhat better.  I advised her to use her inhaler as prescribed and if her symptoms worsen to the point she has to seek medical care, she is to call us.  She will come in as scheduled otherwise and will bring her inhaler with her.  I also told her that we will monitor her oxygen level here and give her oxygen during the procedure.  I will note this on her chart so it will be visible to the CRNA, tech, and doctor.  All questions were answered.

## 2016-08-13 ENCOUNTER — Ambulatory Visit (AMBULATORY_SURGERY_CENTER): Payer: Medicare HMO | Admitting: Gastroenterology

## 2016-08-13 ENCOUNTER — Encounter: Payer: Self-pay | Admitting: Gastroenterology

## 2016-08-13 VITALS — BP 122/67 | HR 70 | Temp 98.6°F | Resp 13 | Ht 62.0 in | Wt 102.0 lb

## 2016-08-13 DIAGNOSIS — Z1211 Encounter for screening for malignant neoplasm of colon: Secondary | ICD-10-CM | POA: Diagnosis present

## 2016-08-13 DIAGNOSIS — D123 Benign neoplasm of transverse colon: Secondary | ICD-10-CM | POA: Diagnosis not present

## 2016-08-13 DIAGNOSIS — Z1212 Encounter for screening for malignant neoplasm of rectum: Secondary | ICD-10-CM

## 2016-08-13 DIAGNOSIS — J45909 Unspecified asthma, uncomplicated: Secondary | ICD-10-CM | POA: Diagnosis not present

## 2016-08-13 MED ORDER — SODIUM CHLORIDE 0.9 % IV SOLN: 500.0000 mL | INTRAVENOUS | Status: DC

## 2016-08-13 NOTE — Progress Notes (Signed)
Spontaneous respirations throughout. VSS. Resting comfortably. To PACU on room air. Report to  Sara RN. 

## 2016-08-13 NOTE — Progress Notes (Signed)
Pt has inhaler with her name at the bedside. maw

## 2016-08-13 NOTE — Progress Notes (Signed)
Pt's states no medical or surgical changes since previsit or office visit. maw 

## 2016-08-13 NOTE — Op Note (Signed)
[facility] Patient Name: Tiaa Kiah Procedure Date: 08/13/2016 8:48 AM MRN: [id number] Endoscopist: Ladene Artist , MD Age: 71 Referring MD:  Date of Birth: 1946-02-06 Gender: Female Account #: [id number] Procedure:                Colonoscopy Indications:              Screening for colorectal malignant neoplasm Medicines:                Monitored Anesthesia Care Procedure:                Pre-Anesthesia Assessment:                           - Prior to the procedure, a History and Physical                            was performed, and patient medications and                            allergies were reviewed. The patient's tolerance of                            previous anesthesia was also reviewed. The risks                            and benefits of the procedure and the sedation                            options and risks were discussed with the patient.                            All questions were answered, and informed consent                            was obtained. Prior Anticoagulants: The patient has                            taken no previous anticoagulant or antiplatelet                            agents. ASA Grade Assessment: II - A patient with                            mild systemic disease. After reviewing the risks                            and benefits, the patient was deemed in                            satisfactory condition to undergo the procedure.                           After obtaining informed consent, the colonoscope  was passed under direct vision. Throughout the                            procedure, the patient's blood pressure, pulse, and                            oxygen saturations were monitored continuously. The                            Colonoscope was introduced through the anus and                            advanced to the the cecum, identified by                            appendiceal orifice and  ileocecal valve. The                            ileocecal valve, appendiceal orifice, and rectum                            were photographed. The quality of the bowel                            preparation was good. The colonoscopy was performed                            without difficulty. The patient tolerated the                            procedure well. Scope In: 8:53:39 AM Scope Out: 9:05:09 AM Scope Withdrawal Time: 0 hours 9 minutes 6 seconds  Total Procedure Duration: 0 hours 11 minutes 30 seconds  Findings:                 The perianal and digital rectal examinations were                            normal.                           A 4 mm polyp was found in the transverse colon. The                            polyp was sessile. The polyp was removed with a                            cold forceps. Resection and retrieval were complete.                           Internal hemorrhoids were found during                            retroflexion. The hemorrhoids were small and Grade  I (internal hemorrhoids that do not prolapse).                           Multiple medium-mouthed diverticula were found in                            the sigmoid colon.                           The exam was otherwise without abnormality on                            direct and retroflexion views. Complications:            No immediate complications. Estimated blood loss:                            None. Estimated Blood Loss:     Estimated blood loss: none. Impression:               - One 4 mm polyp in the transverse colon, removed                            with a cold snare. Resected and retrieved.                           - Internal hemorrhoids.                           - Diverticulosis in the sigmoid colon.                           - The examination was otherwise normal on direct                            and retroflexion views. Recommendation:           - Repeat  colonoscopy in 5 years for surveillance if                            polyp is precancerous, otherwise 10 years for                            screening.                           - Patient has a contact number available for                            emergencies. The signs and symptoms of potential                            delayed complications were discussed with the                            patient. Return to normal activities tomorrow.  Written discharge instructions were provided to the                            patient.                           - High fiber diet.                           - Continue present medications.                           - Await pathology results. Ladene Artist, MD 08/13/2016 9:08:10 AM This report has been signed electronically.

## 2016-08-13 NOTE — Patient Instructions (Signed)
YOU HAD AN ENDOSCOPIC PROCEDURE TODAY AT [facility]:   Refer to the procedure report that was given to you for any specific questions about what was found during the examination.  If the procedure report does not answer your questions, please call your gastroenterologist to clarify.  If you requested that your care partner not be given the details of your procedure findings, then the procedure report has been included in a sealed envelope for you to review at your convenience later.  YOU SHOULD EXPECT: Some feelings of bloating in the abdomen. Passage of more gas than usual.  Walking can help get rid of the air that was put into your GI tract during the procedure and reduce the bloating. If you had a lower endoscopy (such as a colonoscopy or flexible sigmoidoscopy) you may notice spotting of blood in your stool or on the toilet paper. If you underwent a bowel prep for your procedure, you may not have a normal bowel movement for a few days.  Please Note:  You might notice some irritation and congestion in your nose or some drainage.  This is from the oxygen used during your procedure.  There is no need for concern and it should clear up in a day or so.  SYMPTOMS TO REPORT IMMEDIATELY:   Following lower endoscopy (colonoscopy or flexible sigmoidoscopy):  Excessive amounts of blood in the stool  Significant tenderness or worsening of abdominal pains  Swelling of the abdomen that is new, acute  Fever of 100F or higher    For urgent or emergent issues, a gastroenterologist can be reached at any hour by calling [phone].   DIET:  We do recommend a small meal at first, but then you may proceed to your regular diet.  Drink plenty of fluids but you should avoid alcoholic beverages for 24 hours.  ACTIVITY:  You should plan to take it easy for the rest of today and you should NOT DRIVE or use heavy machinery until tomorrow (because of the sedation medicines used during the test).     FOLLOW UP: Our staff will call the number listed on your records the next business day following your procedure to check on you and address any questions or concerns that you may have regarding the information given to you following your procedure. If we do not reach you, we will leave a message.  However, if you are feeling well and you are not experiencing any problems, there is no need to return our call.  We will assume that you have returned to your regular daily activities without incident.  If any biopsies were taken you will be contacted by phone or by letter within the next 1-3 weeks.  Please call us at [phone] if you have not heard about the biopsies in 3 weeks.   Thank you for allowing [place] to provide for your healthcare needs today.   SIGNATURES/CONFIDENTIALITY: You and/or your care partner have signed paperwork which will be entered into your electronic medical record.  These signatures attest to the fact that that the information above on your After Visit Summary has been reviewed and is understood.  Full responsibility of the confidentiality of this discharge information lies with you and/or your care-partner.   Please see all handouts given to you by your recovery nurse today to include polyps, diverticulosis, high-fiber diet, and hemorrhoids.

## 2016-08-13 NOTE — Progress Notes (Signed)
Called to room to assist during endoscopic procedure.  Patient ID and intended procedure confirmed with present staff. Received instructions for my participation in the procedure from the performing physician.  

## 2016-08-14 ENCOUNTER — Telehealth: Payer: Self-pay

## 2016-08-14 NOTE — Telephone Encounter (Signed)
  Follow up Call-  Call back number 08/13/2016  Post procedure Call Back phone  # [phone] cell  Permission to leave phone message Yes  Some recent data might be hidden     Patient questions:  Do you have a fever, pain , or abdominal swelling? No. Pain Score  0 *  Have you tolerated food without any problems? Yes.    Have you been able to return to your normal activities? Yes.    Do you have any questions about your discharge instructions: Diet   No. Medications  No. Follow up visit  No.  Do you have questions or concerns about your Care? No.  Actions: * If pain score is 4 or above: No action needed, pain <4.

## 2016-08-23 ENCOUNTER — Encounter: Payer: Self-pay | Admitting: Gastroenterology

## 2016-09-16 DIAGNOSIS — F418 Other specified anxiety disorders: Secondary | ICD-10-CM | POA: Diagnosis not present

## 2016-09-16 DIAGNOSIS — R490 Dysphonia: Secondary | ICD-10-CM | POA: Diagnosis not present

## 2016-09-16 DIAGNOSIS — Z681 Body mass index (BMI) 19 or less, adult: Secondary | ICD-10-CM | POA: Diagnosis not present

## 2016-09-17 DIAGNOSIS — F418 Other specified anxiety disorders: Secondary | ICD-10-CM | POA: Diagnosis not present

## 2016-10-04 DIAGNOSIS — F418 Other specified anxiety disorders: Secondary | ICD-10-CM | POA: Diagnosis not present

## 2016-10-21 DIAGNOSIS — F419 Anxiety disorder, unspecified: Secondary | ICD-10-CM | POA: Diagnosis not present

## 2016-11-12 DIAGNOSIS — F419 Anxiety disorder, unspecified: Secondary | ICD-10-CM | POA: Diagnosis not present

## 2017-01-10 DIAGNOSIS — L718 Other rosacea: Secondary | ICD-10-CM | POA: Diagnosis not present

## 2017-01-10 DIAGNOSIS — L821 Other seborrheic keratosis: Secondary | ICD-10-CM | POA: Diagnosis not present

## 2017-01-10 DIAGNOSIS — D225 Melanocytic nevi of trunk: Secondary | ICD-10-CM | POA: Diagnosis not present

## 2017-01-10 DIAGNOSIS — D1801 Hemangioma of skin and subcutaneous tissue: Secondary | ICD-10-CM | POA: Diagnosis not present

## 2017-01-31 ENCOUNTER — Encounter (HOSPITAL_COMMUNITY): Payer: Self-pay

## 2017-02-11 DIAGNOSIS — H524 Presbyopia: Secondary | ICD-10-CM | POA: Diagnosis not present

## 2017-02-11 DIAGNOSIS — H2513 Age-related nuclear cataract, bilateral: Secondary | ICD-10-CM | POA: Diagnosis not present

## 2017-02-11 DIAGNOSIS — H04123 Dry eye syndrome of bilateral lacrimal glands: Secondary | ICD-10-CM | POA: Diagnosis not present

## 2017-06-09 DIAGNOSIS — R82998 Other abnormal findings in urine: Secondary | ICD-10-CM | POA: Diagnosis not present

## 2017-06-09 DIAGNOSIS — Z79899 Other long term (current) drug therapy: Secondary | ICD-10-CM | POA: Diagnosis not present

## 2017-06-09 DIAGNOSIS — M810 Age-related osteoporosis without current pathological fracture: Secondary | ICD-10-CM | POA: Diagnosis not present

## 2017-06-16 DIAGNOSIS — Z681 Body mass index (BMI) 19 or less, adult: Secondary | ICD-10-CM | POA: Diagnosis not present

## 2017-06-16 DIAGNOSIS — M25511 Pain in right shoulder: Secondary | ICD-10-CM | POA: Diagnosis not present

## 2017-06-16 DIAGNOSIS — Z8481 Family history of carrier of genetic disease: Secondary | ICD-10-CM | POA: Diagnosis not present

## 2017-06-16 DIAGNOSIS — Z1389 Encounter for screening for other disorder: Secondary | ICD-10-CM | POA: Diagnosis not present

## 2017-06-16 DIAGNOSIS — Z0000 Encounter for general adult medical examination without abnormal findings: Secondary | ICD-10-CM | POA: Diagnosis not present

## 2017-06-16 DIAGNOSIS — F418 Other specified anxiety disorders: Secondary | ICD-10-CM | POA: Diagnosis not present

## 2017-06-16 DIAGNOSIS — M810 Age-related osteoporosis without current pathological fracture: Secondary | ICD-10-CM | POA: Diagnosis not present

## 2017-06-19 ENCOUNTER — Other Ambulatory Visit: Payer: Self-pay | Admitting: Internal Medicine

## 2017-06-19 DIAGNOSIS — Z1231 Encounter for screening mammogram for malignant neoplasm of breast: Secondary | ICD-10-CM

## 2017-06-27 DIAGNOSIS — M7712 Lateral epicondylitis, left elbow: Secondary | ICD-10-CM | POA: Diagnosis not present

## 2017-06-27 DIAGNOSIS — M67911 Unspecified disorder of synovium and tendon, right shoulder: Secondary | ICD-10-CM | POA: Diagnosis not present

## 2017-07-01 DIAGNOSIS — M7541 Impingement syndrome of right shoulder: Secondary | ICD-10-CM | POA: Diagnosis not present

## 2017-07-07 DIAGNOSIS — M7541 Impingement syndrome of right shoulder: Secondary | ICD-10-CM | POA: Diagnosis not present

## 2017-07-09 DIAGNOSIS — M7541 Impingement syndrome of right shoulder: Secondary | ICD-10-CM | POA: Diagnosis not present

## 2017-07-16 DIAGNOSIS — M7541 Impingement syndrome of right shoulder: Secondary | ICD-10-CM | POA: Diagnosis not present

## 2017-08-06 ENCOUNTER — Ambulatory Visit
Admission: RE | Admit: 2017-08-06 | Discharge: 2017-08-06 | Disposition: A | Discharge status: Home / Self Care | Payer: Medicare HMO | Source: Ambulatory Visit | Attending: Internal Medicine | Admitting: Internal Medicine

## 2017-08-06 DIAGNOSIS — Z1231 Encounter for screening mammogram for malignant neoplasm of breast: Secondary | ICD-10-CM

## 2017-08-27 DIAGNOSIS — Z681 Body mass index (BMI) 19 or less, adult: Secondary | ICD-10-CM | POA: Diagnosis not present

## 2017-08-27 DIAGNOSIS — F418 Other specified anxiety disorders: Secondary | ICD-10-CM | POA: Diagnosis not present

## 2017-08-27 DIAGNOSIS — H10403 Unspecified chronic conjunctivitis, bilateral: Secondary | ICD-10-CM | POA: Diagnosis not present

## 2017-09-08 DIAGNOSIS — F419 Anxiety disorder, unspecified: Secondary | ICD-10-CM | POA: Diagnosis not present

## 2017-09-22 DIAGNOSIS — F419 Anxiety disorder, unspecified: Secondary | ICD-10-CM | POA: Diagnosis not present

## 2017-11-07 DIAGNOSIS — F419 Anxiety disorder, unspecified: Secondary | ICD-10-CM | POA: Diagnosis not present

## 2017-12-19 DIAGNOSIS — F419 Anxiety disorder, unspecified: Secondary | ICD-10-CM | POA: Diagnosis not present

## 2018-01-09 DIAGNOSIS — F902 Attention-deficit hyperactivity disorder, combined type: Secondary | ICD-10-CM | POA: Diagnosis not present

## 2018-01-09 DIAGNOSIS — F419 Anxiety disorder, unspecified: Secondary | ICD-10-CM | POA: Diagnosis not present

## 2018-01-09 DIAGNOSIS — Z1339 Encounter for screening examination for other mental health and behavioral disorders: Secondary | ICD-10-CM | POA: Diagnosis not present

## 2018-02-02 DIAGNOSIS — N939 Abnormal uterine and vaginal bleeding, unspecified: Secondary | ICD-10-CM | POA: Diagnosis not present

## 2018-02-02 DIAGNOSIS — N760 Acute vaginitis: Secondary | ICD-10-CM | POA: Diagnosis not present

## 2018-02-04 DIAGNOSIS — F419 Anxiety disorder, unspecified: Secondary | ICD-10-CM | POA: Diagnosis not present

## 2018-02-04 DIAGNOSIS — F902 Attention-deficit hyperactivity disorder, combined type: Secondary | ICD-10-CM | POA: Diagnosis not present

## 2018-02-17 DIAGNOSIS — H04123 Dry eye syndrome of bilateral lacrimal glands: Secondary | ICD-10-CM | POA: Diagnosis not present

## 2018-02-17 DIAGNOSIS — H524 Presbyopia: Secondary | ICD-10-CM | POA: Diagnosis not present

## 2018-02-17 DIAGNOSIS — H2513 Age-related nuclear cataract, bilateral: Secondary | ICD-10-CM | POA: Diagnosis not present

## 2018-03-09 DIAGNOSIS — N760 Acute vaginitis: Secondary | ICD-10-CM | POA: Diagnosis not present

## 2018-03-09 DIAGNOSIS — N952 Postmenopausal atrophic vaginitis: Secondary | ICD-10-CM | POA: Diagnosis not present

## 2018-03-09 DIAGNOSIS — Z113 Encounter for screening for infections with a predominantly sexual mode of transmission: Secondary | ICD-10-CM | POA: Diagnosis not present

## 2018-04-01 DIAGNOSIS — Z01419 Encounter for gynecological examination (general) (routine) without abnormal findings: Secondary | ICD-10-CM | POA: Diagnosis not present

## 2018-04-01 DIAGNOSIS — Z681 Body mass index (BMI) 19 or less, adult: Secondary | ICD-10-CM | POA: Diagnosis not present

## 2018-04-23 DIAGNOSIS — R0789 Other chest pain: Secondary | ICD-10-CM | POA: Diagnosis not present

## 2018-04-23 DIAGNOSIS — Z681 Body mass index (BMI) 19 or less, adult: Secondary | ICD-10-CM | POA: Diagnosis not present

## 2018-04-23 DIAGNOSIS — R109 Unspecified abdominal pain: Secondary | ICD-10-CM | POA: Diagnosis not present

## 2018-04-23 DIAGNOSIS — R10814 Left lower quadrant abdominal tenderness: Secondary | ICD-10-CM | POA: Diagnosis not present

## 2018-04-23 DIAGNOSIS — R142 Eructation: Secondary | ICD-10-CM | POA: Diagnosis not present

## 2018-04-23 DIAGNOSIS — K589 Irritable bowel syndrome without diarrhea: Secondary | ICD-10-CM | POA: Diagnosis not present

## 2018-04-23 DIAGNOSIS — K5730 Diverticulosis of large intestine without perforation or abscess without bleeding: Secondary | ICD-10-CM | POA: Diagnosis not present

## 2018-06-12 DIAGNOSIS — Z0000 Encounter for general adult medical examination without abnormal findings: Secondary | ICD-10-CM | POA: Diagnosis not present

## 2018-06-12 DIAGNOSIS — R82998 Other abnormal findings in urine: Secondary | ICD-10-CM | POA: Diagnosis not present

## 2018-06-12 DIAGNOSIS — M810 Age-related osteoporosis without current pathological fracture: Secondary | ICD-10-CM | POA: Diagnosis not present

## 2018-06-12 DIAGNOSIS — Z79899 Other long term (current) drug therapy: Secondary | ICD-10-CM | POA: Diagnosis not present

## 2018-06-19 DIAGNOSIS — Z1389 Encounter for screening for other disorder: Secondary | ICD-10-CM | POA: Diagnosis not present

## 2018-06-19 DIAGNOSIS — K589 Irritable bowel syndrome without diarrhea: Secondary | ICD-10-CM | POA: Diagnosis not present

## 2018-06-19 DIAGNOSIS — Z0000 Encounter for general adult medical examination without abnormal findings: Secondary | ICD-10-CM | POA: Diagnosis not present

## 2018-06-19 DIAGNOSIS — Z8481 Family history of carrier of genetic disease: Secondary | ICD-10-CM | POA: Diagnosis not present

## 2018-06-19 DIAGNOSIS — F418 Other specified anxiety disorders: Secondary | ICD-10-CM | POA: Diagnosis not present

## 2018-06-19 DIAGNOSIS — M810 Age-related osteoporosis without current pathological fracture: Secondary | ICD-10-CM | POA: Diagnosis not present

## 2018-06-19 DIAGNOSIS — J45998 Other asthma: Secondary | ICD-10-CM | POA: Diagnosis not present

## 2018-06-19 DIAGNOSIS — Z681 Body mass index (BMI) 19 or less, adult: Secondary | ICD-10-CM | POA: Diagnosis not present

## 2018-07-10 ENCOUNTER — Other Ambulatory Visit: Payer: Self-pay | Admitting: Internal Medicine

## 2018-07-10 DIAGNOSIS — H52209 Unspecified astigmatism, unspecified eye: Secondary | ICD-10-CM | POA: Diagnosis not present

## 2018-07-10 DIAGNOSIS — H524 Presbyopia: Secondary | ICD-10-CM | POA: Diagnosis not present

## 2018-07-10 DIAGNOSIS — Z1231 Encounter for screening mammogram for malignant neoplasm of breast: Secondary | ICD-10-CM

## 2018-07-10 DIAGNOSIS — H5203 Hypermetropia, bilateral: Secondary | ICD-10-CM | POA: Diagnosis not present

## 2018-09-02 ENCOUNTER — Ambulatory Visit: Payer: Medicare HMO

## 2018-10-21 ENCOUNTER — Ambulatory Visit: Payer: Medicare HMO

## 2018-11-12 ENCOUNTER — Ambulatory Visit
Admission: RE | Admit: 2018-11-12 | Discharge: 2018-11-12 | Disposition: A | Discharge status: Home / Self Care | Payer: Medicare HMO | Source: Ambulatory Visit | Attending: Internal Medicine | Admitting: Internal Medicine

## 2018-11-12 ENCOUNTER — Ambulatory Visit: Payer: Medicare HMO

## 2018-11-12 ENCOUNTER — Other Ambulatory Visit: Payer: Self-pay

## 2018-11-12 DIAGNOSIS — Z1231 Encounter for screening mammogram for malignant neoplasm of breast: Secondary | ICD-10-CM | POA: Diagnosis not present

## 2019-04-25 DIAGNOSIS — Z20828 Contact with and (suspected) exposure to other viral communicable diseases: Secondary | ICD-10-CM | POA: Diagnosis not present

## 2019-05-21 DIAGNOSIS — H524 Presbyopia: Secondary | ICD-10-CM | POA: Diagnosis not present

## 2019-05-21 DIAGNOSIS — H2513 Age-related nuclear cataract, bilateral: Secondary | ICD-10-CM | POA: Diagnosis not present

## 2019-05-21 DIAGNOSIS — H04123 Dry eye syndrome of bilateral lacrimal glands: Secondary | ICD-10-CM | POA: Diagnosis not present

## 2019-06-15 DIAGNOSIS — Z0000 Encounter for general adult medical examination without abnormal findings: Secondary | ICD-10-CM | POA: Diagnosis not present

## 2019-06-15 DIAGNOSIS — M810 Age-related osteoporosis without current pathological fracture: Secondary | ICD-10-CM | POA: Diagnosis not present

## 2019-06-15 DIAGNOSIS — R7989 Other specified abnormal findings of blood chemistry: Secondary | ICD-10-CM | POA: Diagnosis not present

## 2019-06-16 DIAGNOSIS — R82998 Other abnormal findings in urine: Secondary | ICD-10-CM | POA: Diagnosis not present

## 2019-06-18 DIAGNOSIS — Z1212 Encounter for screening for malignant neoplasm of rectum: Secondary | ICD-10-CM | POA: Diagnosis not present

## 2019-06-21 DIAGNOSIS — Z1331 Encounter for screening for depression: Secondary | ICD-10-CM | POA: Diagnosis not present

## 2019-06-21 DIAGNOSIS — Z0000 Encounter for general adult medical examination without abnormal findings: Secondary | ICD-10-CM | POA: Diagnosis not present

## 2019-06-21 DIAGNOSIS — M810 Age-related osteoporosis without current pathological fracture: Secondary | ICD-10-CM | POA: Diagnosis not present

## 2019-06-21 DIAGNOSIS — F419 Anxiety disorder, unspecified: Secondary | ICD-10-CM | POA: Diagnosis not present

## 2019-06-21 DIAGNOSIS — J309 Allergic rhinitis, unspecified: Secondary | ICD-10-CM | POA: Diagnosis not present

## 2019-06-21 DIAGNOSIS — Z8481 Family history of carrier of genetic disease: Secondary | ICD-10-CM | POA: Diagnosis not present

## 2019-06-21 DIAGNOSIS — J45909 Unspecified asthma, uncomplicated: Secondary | ICD-10-CM | POA: Diagnosis not present

## 2019-07-25 ENCOUNTER — Ambulatory Visit: Payer: Medicare HMO | Attending: Internal Medicine

## 2019-07-25 DIAGNOSIS — Z23 Encounter for immunization: Secondary | ICD-10-CM | POA: Insufficient documentation

## 2019-07-25 NOTE — Progress Notes (Signed)
   Covid-19 Vaccination Clinic  Name:  Alexis Phillips    MRN: [id number] DOB: [birth year]  07/25/2019  [name] was observed post Covid-19 immunization for 15 minutes without incidence. She was provided with Vaccine Information Sheet and instruction to access the V-Safe system.   [name] was instructed to call 911 with any severe reactions post vaccine: Marland Kitchen Difficulty breathing  . Swelling of your face and throat  . A fast heartbeat  . A bad rash all over your body  . Dizziness and weakness    Immunizations Administered    Name Date Dose VIS Date Route   Pfizer COVID-19 Vaccine 07/25/2019  8:33 AM 0.3 mL 05/21/2019 Intramuscular   Manufacturer: [place]   Lot: [id number]   [place]: [id number]

## 2019-08-11 DIAGNOSIS — M810 Age-related osteoporosis without current pathological fracture: Secondary | ICD-10-CM | POA: Diagnosis not present

## 2019-08-17 ENCOUNTER — Ambulatory Visit: Payer: Medicare HMO | Attending: Internal Medicine

## 2019-08-17 DIAGNOSIS — Z23 Encounter for immunization: Secondary | ICD-10-CM

## 2019-08-17 NOTE — Progress Notes (Signed)
   Covid-19 Vaccination Clinic  Name:  Alexis Phillips    MRN: [id number] DOB: 1945-11-30  08/17/2019  [name] was observed post Covid-19 immunization for 15 minutes without incident. She was provided with Vaccine Information Sheet and instruction to access the V-Safe system.   [name] was instructed to call 911 with any severe reactions post vaccine: Marland Kitchen Difficulty breathing  . Swelling of face and throat  . A fast heartbeat  . A bad rash all over body  . Dizziness and weakness   Immunizations Administered    Name Date Dose VIS Date Route   Pfizer COVID-19 Vaccine 08/17/2019  8:36 AM 0.3 mL 05/21/2019 Intramuscular   Manufacturer: [place]   Lot: [id number]   [place]: [id number]

## 2019-08-18 DIAGNOSIS — M810 Age-related osteoporosis without current pathological fracture: Secondary | ICD-10-CM | POA: Diagnosis not present

## 2019-11-09 ENCOUNTER — Other Ambulatory Visit: Payer: Self-pay | Admitting: Internal Medicine

## 2019-11-09 DIAGNOSIS — Z1231 Encounter for screening mammogram for malignant neoplasm of breast: Secondary | ICD-10-CM

## 2019-11-16 ENCOUNTER — Ambulatory Visit
Admission: RE | Admit: 2019-11-16 | Discharge: 2019-11-16 | Disposition: A | Discharge status: Home / Self Care | Payer: Medicare HMO | Source: Ambulatory Visit | Attending: Internal Medicine | Admitting: Internal Medicine

## 2019-11-16 ENCOUNTER — Other Ambulatory Visit: Payer: Self-pay

## 2019-11-16 DIAGNOSIS — Z1231 Encounter for screening mammogram for malignant neoplasm of breast: Secondary | ICD-10-CM | POA: Diagnosis not present

## 2019-11-18 ENCOUNTER — Other Ambulatory Visit: Payer: Self-pay | Admitting: Internal Medicine

## 2019-11-18 DIAGNOSIS — R928 Other abnormal and inconclusive findings on diagnostic imaging of breast: Secondary | ICD-10-CM

## 2019-11-24 ENCOUNTER — Ambulatory Visit
Admission: RE | Admit: 2019-11-24 | Discharge: 2019-11-24 | Disposition: A | Discharge status: Home / Self Care | Payer: Medicare HMO | Source: Ambulatory Visit | Attending: Internal Medicine | Admitting: Internal Medicine

## 2019-11-24 ENCOUNTER — Other Ambulatory Visit: Payer: Self-pay

## 2019-11-24 ENCOUNTER — Other Ambulatory Visit: Payer: Self-pay | Admitting: Internal Medicine

## 2019-11-24 DIAGNOSIS — R921 Mammographic calcification found on diagnostic imaging of breast: Secondary | ICD-10-CM

## 2019-11-24 DIAGNOSIS — R922 Inconclusive mammogram: Secondary | ICD-10-CM | POA: Diagnosis not present

## 2019-11-24 DIAGNOSIS — R928 Other abnormal and inconclusive findings on diagnostic imaging of breast: Secondary | ICD-10-CM

## 2019-11-26 ENCOUNTER — Ambulatory Visit
Admission: RE | Admit: 2019-11-26 | Discharge: 2019-11-26 | Disposition: A | Discharge status: Home / Self Care | Payer: Medicare HMO | Source: Ambulatory Visit | Attending: Internal Medicine | Admitting: Internal Medicine

## 2019-11-26 ENCOUNTER — Other Ambulatory Visit: Payer: Self-pay

## 2019-11-26 DIAGNOSIS — R921 Mammographic calcification found on diagnostic imaging of breast: Secondary | ICD-10-CM

## 2019-12-20 DIAGNOSIS — R921 Mammographic calcification found on diagnostic imaging of breast: Secondary | ICD-10-CM | POA: Diagnosis not present

## 2020-01-04 ENCOUNTER — Ambulatory Visit: Payer: Self-pay | Admitting: General Surgery

## 2020-01-04 ENCOUNTER — Other Ambulatory Visit: Payer: Self-pay | Admitting: General Surgery

## 2020-01-04 DIAGNOSIS — R921 Mammographic calcification found on diagnostic imaging of breast: Secondary | ICD-10-CM

## 2020-01-14 DIAGNOSIS — M7702 Medial epicondylitis, left elbow: Secondary | ICD-10-CM | POA: Diagnosis not present

## 2020-01-14 DIAGNOSIS — M67912 Unspecified disorder of synovium and tendon, left shoulder: Secondary | ICD-10-CM | POA: Diagnosis not present

## 2020-02-02 ENCOUNTER — Other Ambulatory Visit: Payer: Self-pay

## 2020-02-02 ENCOUNTER — Encounter (HOSPITAL_BASED_OUTPATIENT_CLINIC_OR_DEPARTMENT_OTHER): Payer: Self-pay | Admitting: General Surgery

## 2020-02-05 ENCOUNTER — Other Ambulatory Visit (HOSPITAL_COMMUNITY)
Admission: RE | Admit: 2020-02-05 | Discharge: 2020-02-05 | Disposition: A | Discharge status: Home / Self Care | Payer: Medicare HMO | Source: Ambulatory Visit | Attending: General Surgery | Admitting: General Surgery

## 2020-02-05 DIAGNOSIS — Z01812 Encounter for preprocedural laboratory examination: Secondary | ICD-10-CM | POA: Diagnosis not present

## 2020-02-05 DIAGNOSIS — Z20822 Contact with and (suspected) exposure to covid-19: Secondary | ICD-10-CM | POA: Insufficient documentation

## 2020-02-05 LAB — SARS CORONAVIRUS 2 (TAT 6-24 HRS): SARS Coronavirus 2: NEGATIVE

## 2020-02-07 MED ORDER — PROTAMINE SULFATE 10 MG/ML IV SOLN
INTRAVENOUS | Status: AC
  Filled 2020-02-07: qty 25

## 2020-02-07 MED ORDER — PROTAMINE SULFATE 10 MG/ML IV SOLN
INTRAVENOUS | Status: AC
  Filled 2020-02-07: qty 20

## 2020-02-08 ENCOUNTER — Ambulatory Visit
Admission: RE | Admit: 2020-02-08 | Discharge: 2020-02-08 | Disposition: A | Discharge status: Home / Self Care | Payer: Medicare HMO | Source: Ambulatory Visit | Attending: General Surgery | Admitting: General Surgery

## 2020-02-08 ENCOUNTER — Other Ambulatory Visit: Payer: Self-pay

## 2020-02-08 DIAGNOSIS — R921 Mammographic calcification found on diagnostic imaging of breast: Secondary | ICD-10-CM | POA: Diagnosis not present

## 2020-02-08 MED ORDER — CHLORHEXIDINE GLUCONATE CLOTH 2 % EX PADS: 6.0000 | MEDICATED_PAD | Freq: Once | CUTANEOUS | Status: DC

## 2020-02-08 NOTE — Progress Notes (Signed)
      Enhanced Recovery after Surgery for Orthopedics Enhanced Recovery after Surgery is a protocol used to improve the stress on your body and your recovery after surgery.  Patient Instructions  . The night before surgery:  o No food after midnight. ONLY clear liquids after midnight  . The day of surgery (if you do NOT have diabetes):  o Drink ONE (1) Pre-Surgery Clear Ensure as directed.   o This drink was given to you during your hospital  pre-op appointment visit. o The pre-op nurse will instruct you on the time to drink the  Pre-Surgery Ensure depending on your surgery time. o Finish the drink at the designated time by the pre-op nurse.  o Nothing else to drink after completing the  Pre-Surgery Clear Ensure.  . The day of surgery (if you have diabetes): o Drink ONE (1) Gatorade 2 (G2) as directed. o This drink was given to you during your hospital  pre-op appointment visit.  o The pre-op nurse will instruct you on the time to drink the   Gatorade 2 (G2) depending on your surgery time. o Color of the Gatorade may vary. Red is not allowed. o Nothing else to drink after completing the  Gatorade 2 (G2).         If you have questions, please contact your surgeon's office.  Surgical soap given to pt with instructions, verbalized understandings.

## 2020-02-09 ENCOUNTER — Ambulatory Visit (HOSPITAL_BASED_OUTPATIENT_CLINIC_OR_DEPARTMENT_OTHER)
Admission: RE | Admit: 2020-02-09 | Discharge: 2020-02-09 | Disposition: A | Discharge status: Home / Self Care | Payer: Medicare HMO | Attending: General Surgery | Admitting: General Surgery

## 2020-02-09 ENCOUNTER — Encounter (HOSPITAL_BASED_OUTPATIENT_CLINIC_OR_DEPARTMENT_OTHER)
Admission: RE | Disposition: A | Discharge status: Home / Self Care | Payer: Self-pay | Source: Home / Self Care | Attending: General Surgery

## 2020-02-09 ENCOUNTER — Ambulatory Visit
Admission: RE | Admit: 2020-02-09 | Discharge: 2020-02-09 | Disposition: A | Discharge status: Home / Self Care | Payer: Medicare HMO | Source: Ambulatory Visit | Attending: General Surgery | Admitting: General Surgery

## 2020-02-09 ENCOUNTER — Ambulatory Visit (HOSPITAL_BASED_OUTPATIENT_CLINIC_OR_DEPARTMENT_OTHER): Payer: Medicare HMO | Admitting: Anesthesiology

## 2020-02-09 ENCOUNTER — Other Ambulatory Visit: Payer: Self-pay

## 2020-02-09 ENCOUNTER — Encounter (HOSPITAL_BASED_OUTPATIENT_CLINIC_OR_DEPARTMENT_OTHER): Payer: Self-pay | Admitting: General Surgery

## 2020-02-09 DIAGNOSIS — Z886 Allergy status to analgesic agent status: Secondary | ICD-10-CM | POA: Insufficient documentation

## 2020-02-09 DIAGNOSIS — Z885 Allergy status to narcotic agent status: Secondary | ICD-10-CM | POA: Diagnosis not present

## 2020-02-09 DIAGNOSIS — N6012 Diffuse cystic mastopathy of left breast: Secondary | ICD-10-CM | POA: Insufficient documentation

## 2020-02-09 DIAGNOSIS — Z881 Allergy status to other antibiotic agents status: Secondary | ICD-10-CM | POA: Diagnosis not present

## 2020-02-09 DIAGNOSIS — R921 Mammographic calcification found on diagnostic imaging of breast: Secondary | ICD-10-CM | POA: Diagnosis not present

## 2020-02-09 DIAGNOSIS — F419 Anxiety disorder, unspecified: Secondary | ICD-10-CM | POA: Insufficient documentation

## 2020-02-09 DIAGNOSIS — Z803 Family history of malignant neoplasm of breast: Secondary | ICD-10-CM | POA: Insufficient documentation

## 2020-02-09 DIAGNOSIS — J45901 Unspecified asthma with (acute) exacerbation: Secondary | ICD-10-CM | POA: Diagnosis not present

## 2020-02-09 DIAGNOSIS — Z880 Allergy status to penicillin: Secondary | ICD-10-CM | POA: Insufficient documentation

## 2020-02-09 DIAGNOSIS — Z882 Allergy status to sulfonamides status: Secondary | ICD-10-CM | POA: Insufficient documentation

## 2020-02-09 HISTORY — PX: BREAST EXCISIONAL BIOPSY: SUR124

## 2020-02-09 HISTORY — PX: RADIOACTIVE SEED GUIDED EXCISIONAL BREAST BIOPSY: SHX6490

## 2020-02-09 SURGERY — RADIOACTIVE SEED GUIDED BREAST BIOPSY
Anesthesia: General | Site: Breast | Laterality: Left

## 2020-02-09 MED ORDER — MEPERIDINE HCL 25 MG/ML IJ SOLN: 6.2500 mg | INTRAMUSCULAR | Status: DC | PRN

## 2020-02-09 MED ORDER — MIDAZOLAM HCL 2 MG/2ML IJ SOLN: 0.5000 mg | Freq: Once | INTRAMUSCULAR | Status: DC | PRN

## 2020-02-09 MED ORDER — DEXAMETHASONE SODIUM PHOSPHATE 10 MG/ML IJ SOLN
INTRAMUSCULAR | Status: DC | PRN
  Administered 2020-02-09: 5 mg via INTRAVENOUS

## 2020-02-09 MED ORDER — ACETAMINOPHEN 500 MG PO TABS: 1000.0000 mg | ORAL_TABLET | Freq: Once | ORAL | Status: DC

## 2020-02-09 MED ORDER — PROPOFOL 10 MG/ML IV BOLUS
INTRAVENOUS | Status: DC | PRN
  Administered 2020-02-09: 150 mg via INTRAVENOUS

## 2020-02-09 MED ORDER — LACTATED RINGERS IV SOLN: INTRAVENOUS | Status: DC

## 2020-02-09 MED ORDER — ACETAMINOPHEN 500 MG PO TABS: 1000.0000 mg | ORAL_TABLET | ORAL | Status: DC

## 2020-02-09 MED ORDER — ACETAMINOPHEN 325 MG PO TABS
650.0000 mg | ORAL_TABLET | Freq: Once | ORAL | Status: AC
  Administered 2020-02-09: 650 mg via ORAL

## 2020-02-09 MED ORDER — PROPOFOL 10 MG/ML IV BOLUS
INTRAVENOUS | Status: AC
  Filled 2020-02-09: qty 20

## 2020-02-09 MED ORDER — ONDANSETRON HCL 4 MG/2ML IJ SOLN
INTRAMUSCULAR | Status: DC | PRN
  Administered 2020-02-09: 4 mg via INTRAVENOUS

## 2020-02-09 MED ORDER — PROMETHAZINE HCL 25 MG/ML IJ SOLN: 6.2500 mg | INTRAMUSCULAR | Status: DC | PRN

## 2020-02-09 MED ORDER — BUPIVACAINE HCL (PF) 0.25 % IJ SOLN
INTRAMUSCULAR | Status: DC | PRN
  Administered 2020-02-09: 10 mL

## 2020-02-09 MED ORDER — OXYCODONE HCL 5 MG/5ML PO SOLN: 5.0000 mg | Freq: Once | ORAL | Status: DC | PRN

## 2020-02-09 MED ORDER — FENTANYL CITRATE (PF) 100 MCG/2ML IJ SOLN: 25.0000 ug | INTRAMUSCULAR | Status: DC | PRN

## 2020-02-09 MED ORDER — BUPIVACAINE HCL (PF) 0.25 % IJ SOLN
INTRAMUSCULAR | Status: AC
  Filled 2020-02-09: qty 30

## 2020-02-09 MED ORDER — OXYCODONE HCL 5 MG PO TABS: 5.0000 mg | ORAL_TABLET | Freq: Once | ORAL | Status: DC | PRN

## 2020-02-09 MED ORDER — ACETAMINOPHEN 325 MG PO TABS
ORAL_TABLET | ORAL | Status: AC
  Filled 2020-02-09: qty 2

## 2020-02-09 MED ORDER — FENTANYL CITRATE (PF) 100 MCG/2ML IJ SOLN
INTRAMUSCULAR | Status: AC
  Filled 2020-02-09: qty 2

## 2020-02-09 MED ORDER — GABAPENTIN 100 MG PO CAPS
ORAL_CAPSULE | ORAL | Status: AC
  Filled 2020-02-09: qty 1

## 2020-02-09 MED ORDER — EPHEDRINE SULFATE-NACL 50-0.9 MG/10ML-% IV SOSY
PREFILLED_SYRINGE | INTRAVENOUS | Status: DC | PRN
  Administered 2020-02-09: 10 mg via INTRAVENOUS

## 2020-02-09 MED ORDER — VANCOMYCIN HCL IN DEXTROSE 1-5 GM/200ML-% IV SOLN
1000.0000 mg | INTRAVENOUS | Status: AC
  Administered 2020-02-09: 1000 mg via INTRAVENOUS

## 2020-02-09 MED ORDER — LIDOCAINE 2% (20 MG/ML) 5 ML SYRINGE
INTRAMUSCULAR | Status: DC | PRN
  Administered 2020-02-09: 20 mg via INTRAVENOUS

## 2020-02-09 MED ORDER — GABAPENTIN 300 MG PO CAPS: 300.0000 mg | ORAL_CAPSULE | ORAL | Status: DC

## 2020-02-09 MED ORDER — TRAMADOL HCL 50 MG PO TABS: 50.0000 mg | ORAL_TABLET | Freq: Four times a day (QID) | ORAL | 0 refills | Status: DC | PRN

## 2020-02-09 MED ORDER — VANCOMYCIN HCL IN DEXTROSE 1-5 GM/200ML-% IV SOLN
INTRAVENOUS | Status: AC
  Filled 2020-02-09: qty 200

## 2020-02-09 MED ORDER — GABAPENTIN 100 MG PO CAPS
100.0000 mg | ORAL_CAPSULE | Freq: Once | ORAL | Status: AC
  Administered 2020-02-09: 100 mg via ORAL

## 2020-02-09 MED ORDER — FENTANYL CITRATE (PF) 250 MCG/5ML IJ SOLN
INTRAMUSCULAR | Status: DC | PRN
Start: 2020-02-09 — End: 2020-02-09
  Administered 2020-02-09: 25 ug via INTRAVENOUS

## 2020-02-09 SURGICAL SUPPLY — 44 items
ADH SKN CLS APL DERMABOND .7 (GAUZE/BANDAGES/DRESSINGS) ×1
APL PRP STRL LF DISP 70% ISPRP (MISCELLANEOUS) ×1
APPLIER CLIP 9.375 MED OPEN (MISCELLANEOUS)
APR CLP MED 9.3 20 MLT OPN (MISCELLANEOUS)
BINDER BREAST MEDIUM (GAUZE/BANDAGES/DRESSINGS) ×2 IMPLANT
BLADE SURG 15 STRL LF DISP TIS (BLADE) ×1 IMPLANT
BLADE SURG 15 STRL SS (BLADE) ×2
CANISTER SUC SOCK COL 7IN (MISCELLANEOUS) ×2 IMPLANT
CANISTER SUCT 1200ML W/VALVE (MISCELLANEOUS) ×2 IMPLANT
CHLORAPREP W/TINT 26 (MISCELLANEOUS) ×2 IMPLANT
CLIP APPLIE 9.375 MED OPEN (MISCELLANEOUS) IMPLANT
COVER BACK TABLE 60X90IN (DRAPES) ×2 IMPLANT
COVER MAYO STAND STRL (DRAPES) ×2 IMPLANT
COVER PROBE W GEL 5X96 (DRAPES) ×2 IMPLANT
COVER WAND RF STERILE (DRAPES) IMPLANT
DECANTER SPIKE VIAL GLASS SM (MISCELLANEOUS) IMPLANT
DERMABOND ADVANCED (GAUZE/BANDAGES/DRESSINGS) ×1
DERMABOND ADVANCED .7 DNX12 (GAUZE/BANDAGES/DRESSINGS) ×1 IMPLANT
DRAPE LAPAROSCOPIC ABDOMINAL (DRAPES) ×2 IMPLANT
DRAPE UTILITY XL STRL (DRAPES) ×2 IMPLANT
ELECT COATED BLADE 2.86 ST (ELECTRODE) ×2 IMPLANT
ELECT REM PT RETURN 9FT ADLT (ELECTROSURGICAL) ×2
ELECTRODE REM PT RTRN 9FT ADLT (ELECTROSURGICAL) ×1 IMPLANT
GLOVE BIO SURGEON STRL SZ7.5 (GLOVE) ×4 IMPLANT
GOWN STRL REUS W/ TWL LRG LVL3 (GOWN DISPOSABLE) ×2 IMPLANT
GOWN STRL REUS W/TWL LRG LVL3 (GOWN DISPOSABLE) ×4
ILLUMINATOR WAVEGUIDE N/F (MISCELLANEOUS) IMPLANT
KIT MARKER MARGIN INK (KITS) ×2 IMPLANT
LIGHT WAVEGUIDE WIDE FLAT (MISCELLANEOUS) IMPLANT
NEEDLE HYPO 25X1 1.5 SAFETY (NEEDLE) ×2 IMPLANT
NS IRRIG 1000ML POUR BTL (IV SOLUTION) ×2 IMPLANT
PACK BASIN DAY SURGERY FS (CUSTOM PROCEDURE TRAY) ×2 IMPLANT
PENCIL SMOKE EVACUATOR (MISCELLANEOUS) ×2 IMPLANT
SLEEVE SCD COMPRESS KNEE MED (MISCELLANEOUS) ×2 IMPLANT
SPONGE LAP 18X18 RF (DISPOSABLE) ×2 IMPLANT
SUT MON AB 4-0 PC3 18 (SUTURE) ×2 IMPLANT
SUT SILK 2 0 SH (SUTURE) IMPLANT
SUT VICRYL 3-0 CR8 SH (SUTURE) ×2 IMPLANT
SYR BULB EAR ULCER 2OZ BL STRL (SYRINGE) ×2 IMPLANT
SYR CONTROL 10ML LL (SYRINGE) ×2 IMPLANT
TOWEL GREEN STERILE FF (TOWEL DISPOSABLE) ×2 IMPLANT
TRAY FAXITRON CT DISP (TRAY / TRAY PROCEDURE) ×2 IMPLANT
TUBE CONNECTING 20X1/4 (TUBING) ×2 IMPLANT
YANKAUER SUCT BULB TIP NO VENT (SUCTIONS) ×2 IMPLANT

## 2020-02-09 NOTE — Op Note (Signed)
02/09/2020  1:31 PM  PATIENT:  Alexis Phillips  74 y.o. female  PRE-OPERATIVE DIAGNOSIS:  LEFT BREAST CALCIFICATION  POST-OPERATIVE DIAGNOSIS:  LEFT BREAST CALCIFICATION  PROCEDURE:  Procedure(s): LEFT BREAST RADIOACTIVE SEED GUIDED EXCISIONAL BREAST BIOPSY (Left)  SURGEON:  Surgeon(s) and Role:    * Jovita Kussmaul, MD - Primary  PHYSICIAN ASSISTANT:   ASSISTANTS: none   ANESTHESIA:   local and general  EBL:  minimal   BLOOD ADMINISTERED:none  DRAINS: none   LOCAL MEDICATIONS USED:  MARCAINE     SPECIMEN:  Source of Specimen:  left breast tissue  DISPOSITION OF SPECIMEN:  PATHOLOGY  COUNTS:  YES  TOURNIQUET:  * No tourniquets in log *  DICTATION: .Dragon Dictation   After informed consent was obtained the patient was brought to the operating room and placed in the supine position on the operating table.  After adequate induction of general anesthesia the patient's left breast was prepped with ChloraPrep, allowed to dry, and draped in usual sterile manner.  An appropriate timeout was performed.  Previously an I-125 seed was placed in the lower inner quadrant of the left breast to mark an area of calcification that was not amenable to stereotactic biopsy.  The neoprobe was set to I-125 in the area of radioactivity was readily identified.  The area around this was infiltrated with quarter percent Marcaine.  A curvilinear incision was made with a 15 blade knife along the inframammary fold closest to the radioactive seed.  The incision was carried through the skin and subcutaneous tissue sharply with the electrocautery.  Dissection was carried to the chest wall and then the breast was separated from the pectoralis muscle superiorly until the dissection was well beyond the area of the radioactive seed.  Next the dissection was carried out anteriorly between the breast tissue and the subcutaneous fat also until the dissection was well beyond the area where the seed was located.  Next a  wedge of breast tissue was then excised sharply from the anterior dissection to the posterior dissection sharply with the electrocautery.  Once the specimen was removed it was oriented with the appropriate paint colors.  A specimen radiograph was obtained that showed the seed to be within the specimen as well as the calcifications.  The specimen was then sent to pathology for further evaluation.  Hemostasis was achieved using the Bovie electrocautery.  The wound was irrigated with saline.  The breast tissue was then reapproximated with layers of interrupted 3-0 Vicryl stitches.  The skin was then closed with a running 4-0 Monocryl subcuticular stitch.  Dermabond dressings were applied.  The patient tolerated the procedure well.  At the end of the case all needle sponge and instrument counts were correct.  The patient was then awakened and taken to recovery in stable condition.  PLAN OF CARE: Discharge to home after PACU  PATIENT DISPOSITION:  PACU - hemodynamically stable.   Delay start of Pharmacological VTE agent (>24hrs) due to surgical blood loss or risk of bleeding: not applicable

## 2020-02-09 NOTE — Anesthesia Postprocedure Evaluation (Signed)
Anesthesia Post Note  Patient: Alexis Phillips  Procedure(s) Performed: LEFT BREAST RADIOACTIVE SEED GUIDED EXCISIONAL BREAST BIOPSY (Left Breast)     Patient location during evaluation: PACU Anesthesia Type: General Level of consciousness: awake and alert, patient cooperative and oriented Pain management: pain level controlled Vital Signs Assessment: post-procedure vital signs reviewed and stable Respiratory status: spontaneous breathing, nonlabored ventilation and respiratory function stable Cardiovascular status: blood pressure returned to baseline and stable Postop Assessment: no apparent nausea or vomiting, adequate PO intake and able to ambulate Anesthetic complications: no   No complications documented.  Last Vitals:  Vitals:   02/09/20 1430 02/09/20 1445  BP: 122/77 120/71  Pulse: 69 76  Resp: 14 18  Temp:  36.4 C  SpO2: 100% 100%    Last Pain:  Vitals:   02/09/20 1430  TempSrc:   PainSc: 0-No pain                 Alexis Phillips,E. Negin Hegg

## 2020-02-09 NOTE — Discharge Instructions (Signed)

## 2020-02-09 NOTE — Transfer of Care (Signed)
Immediate Anesthesia Transfer of Care Note  Patient: Alexis Phillips  Procedure(s) Performed: LEFT BREAST RADIOACTIVE SEED GUIDED EXCISIONAL BREAST BIOPSY (Left Breast)  Patient Location: PACU  Anesthesia Type:General  Level of Consciousness: awake, alert , oriented and patient cooperative  Airway & Oxygen Therapy: Patient Spontanous Breathing and Patient connected to face mask oxygen  Post-op Assessment: Report given to RN, Post -op Vital signs reviewed and stable and Patient moving all extremities  Post vital signs: Reviewed and stable  Last Vitals:  Vitals Value Taken Time  BP 122/71 02/09/20 1345  Temp    Pulse 76 02/09/20 1346  Resp 16 02/09/20 1346  SpO2 100 % 02/09/20 1346  Vitals shown include unvalidated device data.  Last Pain:  Vitals:   02/09/20 1157  TempSrc: Oral  PainSc: 0-No pain      Patients Stated Pain Goal: 1 (64/38/38 1840)  Complications: No complications documented.

## 2020-02-09 NOTE — Anesthesia Preprocedure Evaluation (Addendum)
Anesthesia Evaluation  Patient identified by MRN, date of birth, ID band Patient awake    Reviewed: Allergy & Precautions, NPO status , Patient's Chart, lab work & pertinent test results  History of Anesthesia Complications Negative for: history of anesthetic complications  Airway Mallampati: I  TM Distance: >3 FB Neck ROM: Full    Dental  (+) Dental Advisory Given, Teeth Intact   Pulmonary asthma ,  02/05/2020 SARS coronavirus NEG   breath sounds clear to auscultation       Cardiovascular (-) anginanegative cardio ROS   Rhythm:Regular Rate:Normal     Neuro/Psych negative neurological ROS     GI/Hepatic negative GI ROS, Neg liver ROS,   Endo/Other  negative endocrine ROS  Renal/GU negative Renal ROS     Musculoskeletal   Abdominal   Peds  Hematology negative hematology ROS (+)   Anesthesia Other Findings   Reproductive/Obstetrics                            Anesthesia Physical Anesthesia Plan  ASA: II  Anesthesia Plan: General   Post-op Pain Management:    Induction: Intravenous  PONV Risk Score and Plan: 3 and Ondansetron, Dexamethasone, Treatment may vary due to age or medical condition and Droperidol  Airway Management Planned: LMA  Additional Equipment: None  Intra-op Plan:   Post-operative Plan:   Informed Consent: I have reviewed the patients History and Physical, chart, labs and discussed the procedure including the risks, benefits and alternatives for the proposed anesthesia with the patient or authorized representative who has indicated his/her understanding and acceptance.     Dental advisory given  Plan Discussed with: CRNA and Surgeon  Anesthesia Plan Comments:        Anesthesia Quick Evaluation

## 2020-02-09 NOTE — H&P (Signed)
Alexis Phillips  Location: [facility] Surgery Patient #: [id number] DOB: [birth year] Divorced / Language: Cleophus Molt / Race: White Female   History of Present Illness  The patient is a 74 year old female who presents with a complaint of Breast problems. We are asked to see the patient in consultation by Dr. Velna Hatchet to evaluate her for calcifications in the left breast. The patient is a 74 year old white female who recently went for routine screening mammogram. At that time she was found to have an 8 mm cluster of calcification in the lower central left breast that looked abnormal. Unfortunately this was not amenable to stereotactic biopsy because of her small breast size. A cut the suspicious nature of the calcifications the recommendation is to have this area excised surgically for tissue diagnosis. She does have a family history of a younger sister with breast cancer who is also BRCA positive. The patient herself was genetically tested 3 years ago and was negative. She states that there are multiple other cancers in many other family members. She does not smoke.   Diagnostic Studies History Colonoscopy  1-5 years ago Mammogram  within last year  Allergies  Augmentin *PENICILLINS*  Aspirin *ANALGESICS - NonNarcotic*  Boniva *ENDOCRINE AND METABOLIC AGENTS - MISC.*  Codeine Phosphate *ANALGESICS - OPIOID*  Septra *ANTI-INFECTIVE AGENTS - MISC.*  Allergies Reconciled   Medication History  ALPRAZolam (0.5MG Tablet, Oral) Active. Estradiol (Oral) Specific strength unknown - Active. Medications Reconciled  Pregnancy / Birth History  Age at menarche  57 years. Age of menopause  61-55 Gravida  2 Maternal age  76-25 Para  2  Other Problems Anxiety Disorder  Asthma  Back Pain     Review of Systems  General Not Present- Appetite Loss, Chills, Fatigue, Fever, Night Sweats, Weight Gain and Weight Loss. Skin Not Present- Change in Wart/Mole, Dryness,  Hives, Jaundice, New Lesions, Non-Healing Wounds, Rash and Ulcer. HEENT Present- Seasonal Allergies. Not Present- Earache, Hearing Loss, Hoarseness, Nose Bleed, Oral Ulcers, Ringing in the Ears, Sinus Pain, Sore Throat, Visual Disturbances, Wears glasses/contact lenses and Yellow Eyes. Cardiovascular Not Present- Chest Pain, Difficulty Breathing Lying Down, Leg Cramps, Palpitations, Rapid Heart Rate, Shortness of Breath and Swelling of Extremities. Gastrointestinal Not Present- Abdominal Pain, Bloating, Bloody Stool, Change in Bowel Habits, Chronic diarrhea, Constipation, Difficulty Swallowing, Excessive gas, Gets full quickly at meals, Hemorrhoids, Indigestion, Nausea, Rectal Pain and Vomiting. Female Genitourinary Not Present- Frequency, Nocturia, Painful Urination, Pelvic Pain and Urgency. Musculoskeletal Present- Back Pain and Joint Pain. Not Present- Joint Stiffness, Muscle Pain, Muscle Weakness and Swelling of Extremities. Neurological Not Present- Decreased Memory, Fainting, Headaches, Numbness, Seizures, Tingling, Tremor, Trouble walking and Weakness. Psychiatric Present- Anxiety. Not Present- Bipolar, Change in Sleep Pattern, Depression, Fearful and Frequent crying. Endocrine Not Present- Cold Intolerance, Excessive Hunger, Hair Changes, Heat Intolerance, Hot flashes and New Diabetes. Hematology Not Present- Blood Thinners, Easy Bruising, Excessive bleeding, Gland problems, HIV and Persistent Infections.  Vitals  Weight: 98.38 lb Height: 62in Body Surface Area: 1.41 m Body Mass Index: 17.99 kg/m  Temp.: 98.56F  Pulse: 97 (Regular)        Physical Exam  General Mental Status-Alert. General Appearance-Consistent with stated age. Hydration-Well hydrated. Voice-Normal.  Head and Neck Head-normocephalic, atraumatic with no lesions or palpable masses. Trachea-midline. Thyroid Gland Characteristics - normal size and consistency.  Eye Eyeball -  Bilateral-Extraocular movements intact. Sclera/Conjunctiva - Bilateral-No scleral icterus.  Chest and Lung Exam Chest and lung exam reveals -quiet, even and easy respiratory effort with  no use of accessory muscles and on auscultation, normal breath sounds, no adventitious sounds and normal vocal resonance. Inspection Chest Wall - Normal. Back - normal.  Breast Note: The patient has small entrance symmetric breast tissue bilaterally. There is no palpable mass in either breast. There is no palpable axillary, supraclavicular, or cervical lymphadenopathy.   Cardiovascular Cardiovascular examination reveals -normal heart sounds, regular rate and rhythm with no murmurs and normal pedal pulses bilaterally.  Abdomen Inspection Inspection of the abdomen reveals - No Hernias. Skin - Scar - no surgical scars. Palpation/Percussion Palpation and Percussion of the abdomen reveal - Soft, Non Tender, No Rebound tenderness, No Rigidity (guarding) and No hepatosplenomegaly. Auscultation Auscultation of the abdomen reveals - Bowel sounds normal.  Neurologic Neurologic evaluation reveals -alert and oriented x 3 with no impairment of recent or remote memory. Mental Status-Normal.  Musculoskeletal Normal Exam - Left-Upper Extremity Strength Normal and Lower Extremity Strength Normal. Normal Exam - Right-Upper Extremity Strength Normal and Lower Extremity Strength Normal.  Lymphatic Head & Neck  General Head & Neck Lymphatics: Bilateral - Description - Normal. Axillary  General Axillary Region: Bilateral - Description - Normal. Tenderness - Non Tender. Femoral & Inguinal  Generalized Femoral & Inguinal Lymphatics: Bilateral - Description - Normal. Tenderness - Non Tender.    Assessment & Plan BREAST CALCIFICATION, LEFT (R92.1) Impression: The patient appears to have a small 8 mm area of calcification in the lower central left breast. This was suspicious but not amenable to  needle biopsy. Because of this the recommendation is to have this area excised for tissue diagnosis. This would require a radioactive seed for localization. I have discussed with her in detail the risks and benefits of the operation as well as some of the technical aspects and she understands and wishes to proceed. This patient encounter took 45 minutes today to perform the following: take history, perform exam, review outside records, interpret imaging, counsel the patient on their diagnosis and document encounter, findings & plan in the EHR

## 2020-02-09 NOTE — Anesthesia Procedure Notes (Signed)
Procedure Name: LMA Insertion Date/Time: 02/09/2020 12:56 PM Performed by: Myna Bright, CRNA Pre-anesthesia Checklist: Patient identified, Emergency Drugs available, Suction available and Patient being monitored Patient Re-evaluated:Patient Re-evaluated prior to induction Oxygen Delivery Method: Circle system utilized Preoxygenation: Pre-oxygenation with 100% oxygen Induction Type: IV induction Ventilation: Mask ventilation without difficulty LMA: LMA inserted LMA Size: 3.0 Tube type: Oral Number of attempts: 1 Placement Confirmation: positive ETCO2 and breath sounds checked- equal and bilateral Tube secured with: Tape Dental Injury: Teeth and Oropharynx as per pre-operative assessment

## 2020-02-09 NOTE — Interval H&P Note (Signed)
History and Physical Interval Note:  02/09/2020 12:01 PM  Alexis Phillips  has presented today for surgery, with the diagnosis of LEFT BREAST CALCIFICATION.  The various methods of treatment have been discussed with the patient and family. After consideration of risks, benefits and other options for treatment, the patient has consented to  Procedure(s): LEFT BREAST RADIOACTIVE SEED GUIDED EXCISIONAL BREAST BIOPSY (Left) as a surgical intervention.  The patient's history has been reviewed, patient examined, no change in status, stable for surgery.  I have reviewed the patient's chart and labs.  Questions were answered to the patient's satisfaction.     Autumn Messing III

## 2020-02-10 ENCOUNTER — Encounter (HOSPITAL_BASED_OUTPATIENT_CLINIC_OR_DEPARTMENT_OTHER): Payer: Self-pay | Admitting: General Surgery

## 2020-02-11 LAB — SURGICAL PATHOLOGY

## 2020-03-20 DIAGNOSIS — L710 Perioral dermatitis: Secondary | ICD-10-CM | POA: Diagnosis not present

## 2020-05-19 DIAGNOSIS — K5730 Diverticulosis of large intestine without perforation or abscess without bleeding: Secondary | ICD-10-CM | POA: Diagnosis not present

## 2020-05-19 DIAGNOSIS — M545 Low back pain, unspecified: Secondary | ICD-10-CM | POA: Diagnosis not present

## 2020-05-19 DIAGNOSIS — R1030 Lower abdominal pain, unspecified: Secondary | ICD-10-CM | POA: Diagnosis not present

## 2020-05-19 DIAGNOSIS — F419 Anxiety disorder, unspecified: Secondary | ICD-10-CM | POA: Diagnosis not present

## 2020-05-19 DIAGNOSIS — K5900 Constipation, unspecified: Secondary | ICD-10-CM | POA: Diagnosis not present

## 2020-05-19 DIAGNOSIS — K589 Irritable bowel syndrome without diarrhea: Secondary | ICD-10-CM | POA: Diagnosis not present

## 2020-06-19 ENCOUNTER — Other Ambulatory Visit: Payer: Medicare HMO

## 2020-06-19 DIAGNOSIS — Z20822 Contact with and (suspected) exposure to covid-19: Secondary | ICD-10-CM

## 2020-06-19 DIAGNOSIS — H2513 Age-related nuclear cataract, bilateral: Secondary | ICD-10-CM | POA: Diagnosis not present

## 2020-06-19 DIAGNOSIS — H5203 Hypermetropia, bilateral: Secondary | ICD-10-CM | POA: Diagnosis not present

## 2020-06-19 DIAGNOSIS — H04123 Dry eye syndrome of bilateral lacrimal glands: Secondary | ICD-10-CM | POA: Diagnosis not present

## 2020-06-20 LAB — SARS-COV-2, NAA 2 DAY TAT

## 2020-06-20 LAB — NOVEL CORONAVIRUS, NAA: SARS-CoV-2, NAA: NOT DETECTED

## 2020-07-28 DIAGNOSIS — Z79899 Other long term (current) drug therapy: Secondary | ICD-10-CM | POA: Diagnosis not present

## 2020-07-28 DIAGNOSIS — M810 Age-related osteoporosis without current pathological fracture: Secondary | ICD-10-CM | POA: Diagnosis not present

## 2020-08-04 DIAGNOSIS — Z1331 Encounter for screening for depression: Secondary | ICD-10-CM | POA: Diagnosis not present

## 2020-08-04 DIAGNOSIS — F419 Anxiety disorder, unspecified: Secondary | ICD-10-CM | POA: Diagnosis not present

## 2020-08-04 DIAGNOSIS — Z1339 Encounter for screening examination for other mental health and behavioral disorders: Secondary | ICD-10-CM | POA: Diagnosis not present

## 2020-08-04 DIAGNOSIS — M810 Age-related osteoporosis without current pathological fracture: Secondary | ICD-10-CM | POA: Diagnosis not present

## 2020-08-04 DIAGNOSIS — F902 Attention-deficit hyperactivity disorder, combined type: Secondary | ICD-10-CM | POA: Diagnosis not present

## 2020-08-04 DIAGNOSIS — R002 Palpitations: Secondary | ICD-10-CM | POA: Diagnosis not present

## 2020-08-04 DIAGNOSIS — R928 Other abnormal and inconclusive findings on diagnostic imaging of breast: Secondary | ICD-10-CM | POA: Diagnosis not present

## 2020-08-04 DIAGNOSIS — R82998 Other abnormal findings in urine: Secondary | ICD-10-CM | POA: Diagnosis not present

## 2020-08-04 DIAGNOSIS — Z0000 Encounter for general adult medical examination without abnormal findings: Secondary | ICD-10-CM | POA: Diagnosis not present

## 2020-08-04 DIAGNOSIS — E785 Hyperlipidemia, unspecified: Secondary | ICD-10-CM | POA: Diagnosis not present

## 2020-08-04 DIAGNOSIS — Z86010 Personal history of colonic polyps: Secondary | ICD-10-CM | POA: Diagnosis not present

## 2020-08-04 DIAGNOSIS — N644 Mastodynia: Secondary | ICD-10-CM | POA: Diagnosis not present

## 2020-08-07 ENCOUNTER — Other Ambulatory Visit: Payer: Self-pay | Admitting: Internal Medicine

## 2020-08-07 DIAGNOSIS — Z0000 Encounter for general adult medical examination without abnormal findings: Secondary | ICD-10-CM

## 2020-08-07 DIAGNOSIS — Z1212 Encounter for screening for malignant neoplasm of rectum: Secondary | ICD-10-CM | POA: Diagnosis not present

## 2020-08-14 DIAGNOSIS — H524 Presbyopia: Secondary | ICD-10-CM | POA: Diagnosis not present

## 2020-08-14 DIAGNOSIS — H52223 Regular astigmatism, bilateral: Secondary | ICD-10-CM | POA: Diagnosis not present

## 2020-09-12 ENCOUNTER — Other Ambulatory Visit: Payer: Medicare HMO

## 2020-09-26 ENCOUNTER — Ambulatory Visit
Admission: RE | Admit: 2020-09-26 | Discharge: 2020-09-26 | Disposition: A | Discharge status: Home / Self Care | Payer: No Typology Code available for payment source | Source: Ambulatory Visit | Attending: Internal Medicine | Admitting: Internal Medicine

## 2020-09-26 DIAGNOSIS — Z0000 Encounter for general adult medical examination without abnormal findings: Secondary | ICD-10-CM

## 2020-10-03 DIAGNOSIS — Z124 Encounter for screening for malignant neoplasm of cervix: Secondary | ICD-10-CM | POA: Diagnosis not present

## 2020-10-03 DIAGNOSIS — Z1151 Encounter for screening for human papillomavirus (HPV): Secondary | ICD-10-CM | POA: Diagnosis not present

## 2020-10-03 DIAGNOSIS — Z01419 Encounter for gynecological examination (general) (routine) without abnormal findings: Secondary | ICD-10-CM | POA: Diagnosis not present

## 2020-10-03 DIAGNOSIS — R928 Other abnormal and inconclusive findings on diagnostic imaging of breast: Secondary | ICD-10-CM | POA: Diagnosis not present

## 2020-10-03 DIAGNOSIS — N644 Mastodynia: Secondary | ICD-10-CM | POA: Diagnosis not present

## 2020-10-04 ENCOUNTER — Other Ambulatory Visit: Payer: Self-pay | Admitting: Family Medicine

## 2020-10-04 DIAGNOSIS — Z1231 Encounter for screening mammogram for malignant neoplasm of breast: Secondary | ICD-10-CM

## 2020-12-18 ENCOUNTER — Other Ambulatory Visit: Payer: Self-pay

## 2020-12-18 ENCOUNTER — Ambulatory Visit
Admission: RE | Admit: 2020-12-18 | Discharge: 2020-12-18 | Disposition: A | Discharge status: Home / Self Care | Payer: Medicare HMO | Source: Ambulatory Visit | Attending: Family Medicine | Admitting: Family Medicine

## 2020-12-18 DIAGNOSIS — Z1231 Encounter for screening mammogram for malignant neoplasm of breast: Secondary | ICD-10-CM | POA: Diagnosis not present

## 2020-12-22 DIAGNOSIS — H2513 Age-related nuclear cataract, bilateral: Secondary | ICD-10-CM | POA: Diagnosis not present

## 2021-01-09 DIAGNOSIS — R051 Acute cough: Secondary | ICD-10-CM | POA: Diagnosis not present

## 2021-01-09 DIAGNOSIS — R002 Palpitations: Secondary | ICD-10-CM | POA: Diagnosis not present

## 2021-01-09 DIAGNOSIS — R0789 Other chest pain: Secondary | ICD-10-CM | POA: Diagnosis not present

## 2021-04-18 ENCOUNTER — Ambulatory Visit: Payer: Medicare HMO | Admitting: Gastroenterology

## 2021-04-19 ENCOUNTER — Encounter: Payer: Self-pay | Admitting: Internal Medicine

## 2021-06-25 DIAGNOSIS — H2513 Age-related nuclear cataract, bilateral: Secondary | ICD-10-CM | POA: Diagnosis not present

## 2021-07-05 ENCOUNTER — Encounter (HOSPITAL_COMMUNITY): Payer: Self-pay

## 2021-08-02 DIAGNOSIS — M7702 Medial epicondylitis, left elbow: Secondary | ICD-10-CM | POA: Diagnosis not present

## 2021-08-15 DIAGNOSIS — E785 Hyperlipidemia, unspecified: Secondary | ICD-10-CM | POA: Diagnosis not present

## 2021-08-15 DIAGNOSIS — Z1331 Encounter for screening for depression: Secondary | ICD-10-CM | POA: Diagnosis not present

## 2021-08-15 DIAGNOSIS — Z86010 Personal history of colonic polyps: Secondary | ICD-10-CM | POA: Diagnosis not present

## 2021-08-15 DIAGNOSIS — R002 Palpitations: Secondary | ICD-10-CM | POA: Diagnosis not present

## 2021-08-15 DIAGNOSIS — I2584 Coronary atherosclerosis due to calcified coronary lesion: Secondary | ICD-10-CM | POA: Diagnosis not present

## 2021-08-15 DIAGNOSIS — F902 Attention-deficit hyperactivity disorder, combined type: Secondary | ICD-10-CM | POA: Diagnosis not present

## 2021-08-15 DIAGNOSIS — F419 Anxiety disorder, unspecified: Secondary | ICD-10-CM | POA: Diagnosis not present

## 2021-08-15 DIAGNOSIS — Z0000 Encounter for general adult medical examination without abnormal findings: Secondary | ICD-10-CM | POA: Diagnosis not present

## 2021-08-15 DIAGNOSIS — M810 Age-related osteoporosis without current pathological fracture: Secondary | ICD-10-CM | POA: Diagnosis not present

## 2021-09-06 DIAGNOSIS — M810 Age-related osteoporosis without current pathological fracture: Secondary | ICD-10-CM | POA: Diagnosis not present

## 2021-09-11 DIAGNOSIS — H2513 Age-related nuclear cataract, bilateral: Secondary | ICD-10-CM | POA: Diagnosis not present

## 2021-09-25 DIAGNOSIS — L718 Other rosacea: Secondary | ICD-10-CM | POA: Diagnosis not present

## 2021-09-25 DIAGNOSIS — L821 Other seborrheic keratosis: Secondary | ICD-10-CM | POA: Diagnosis not present

## 2021-11-26 ENCOUNTER — Other Ambulatory Visit: Payer: Self-pay | Admitting: Internal Medicine

## 2021-11-26 DIAGNOSIS — Z1231 Encounter for screening mammogram for malignant neoplasm of breast: Secondary | ICD-10-CM

## 2021-12-09 IMAGING — MG DIGITAL DIAGNOSTIC UNILAT LEFT W/ CAD
5 series · 6 of 17 positions shown · non-contrast
Comparison: Previous exam(s).
COMPARISON: Previous exam(s).
COMPARISON: Previous exam(s).

Addendum:
CLINICAL DATA: Screening recall breast calcifications. The patient
has family history of breast cancer in her sister diagnosed at age
32.

EXAM:
DIGITAL DIAGNOSTIC LEFT MAMMOGRAM WITH CAD

[L CC]
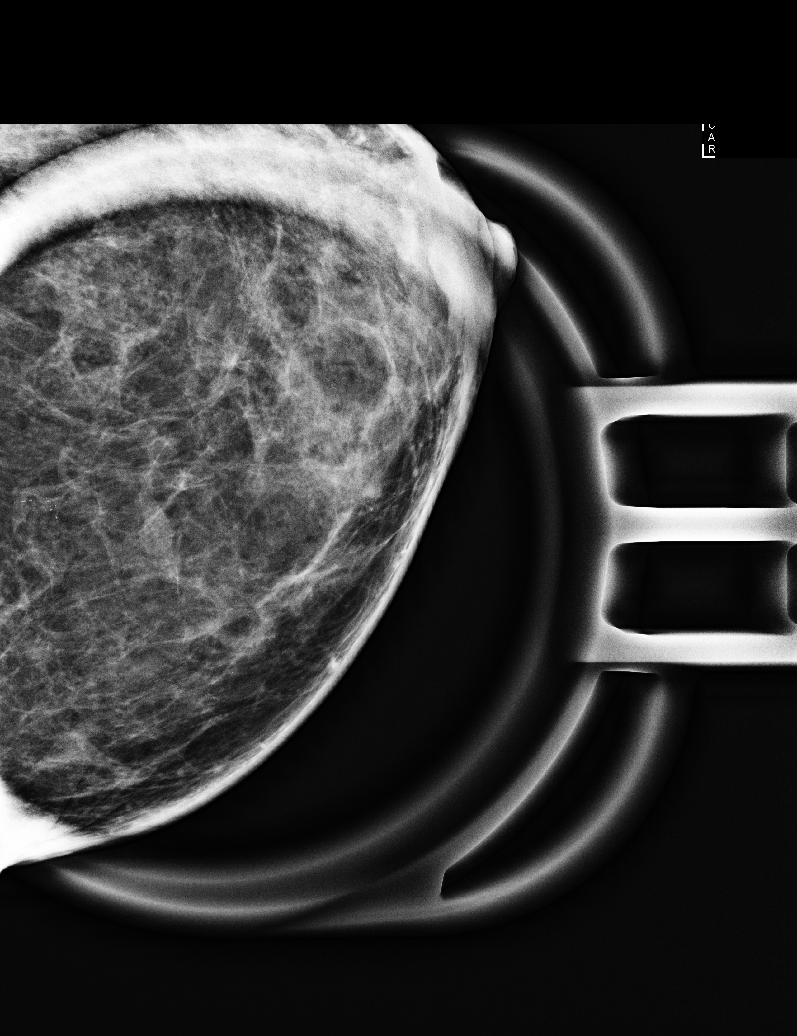

[L ML]
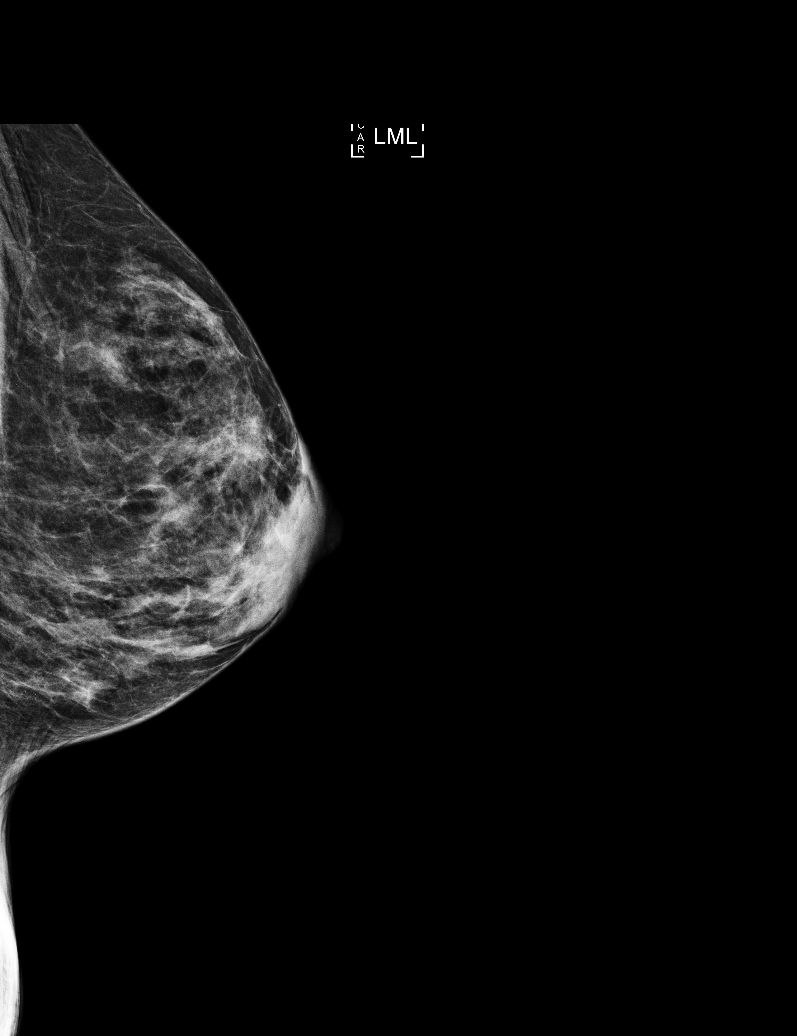

[L ML tomo · 2 of 26 frames shown (1 of 3)]
[frame 9/26]
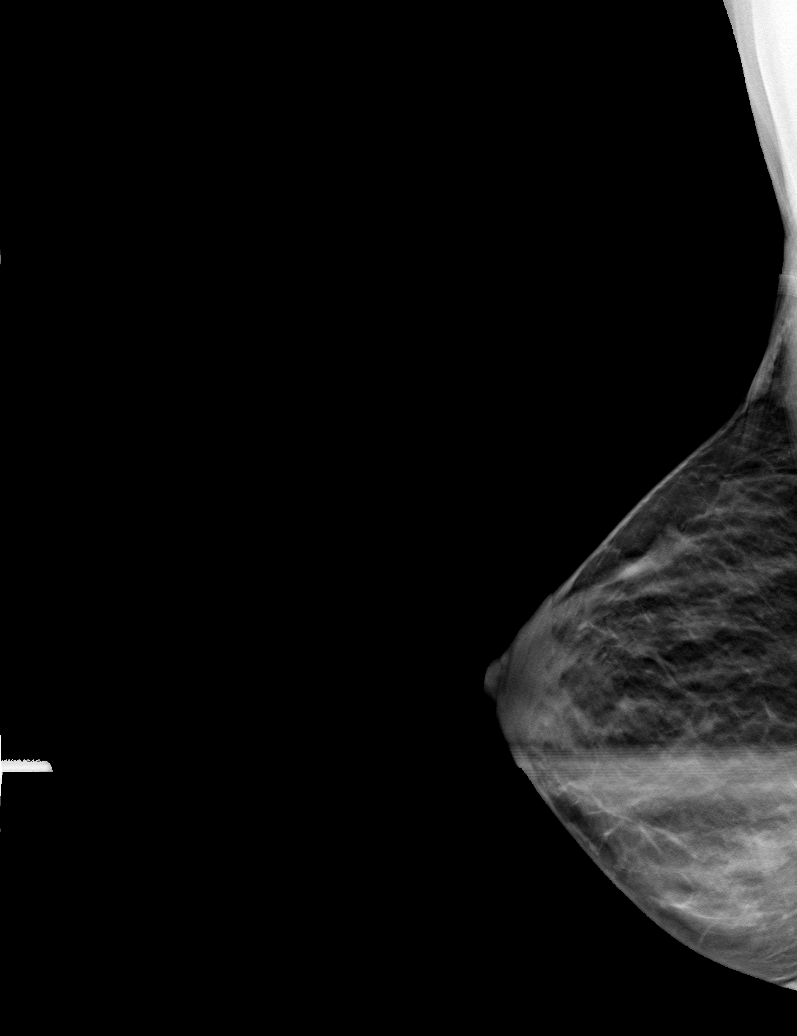
[frame 13/26]
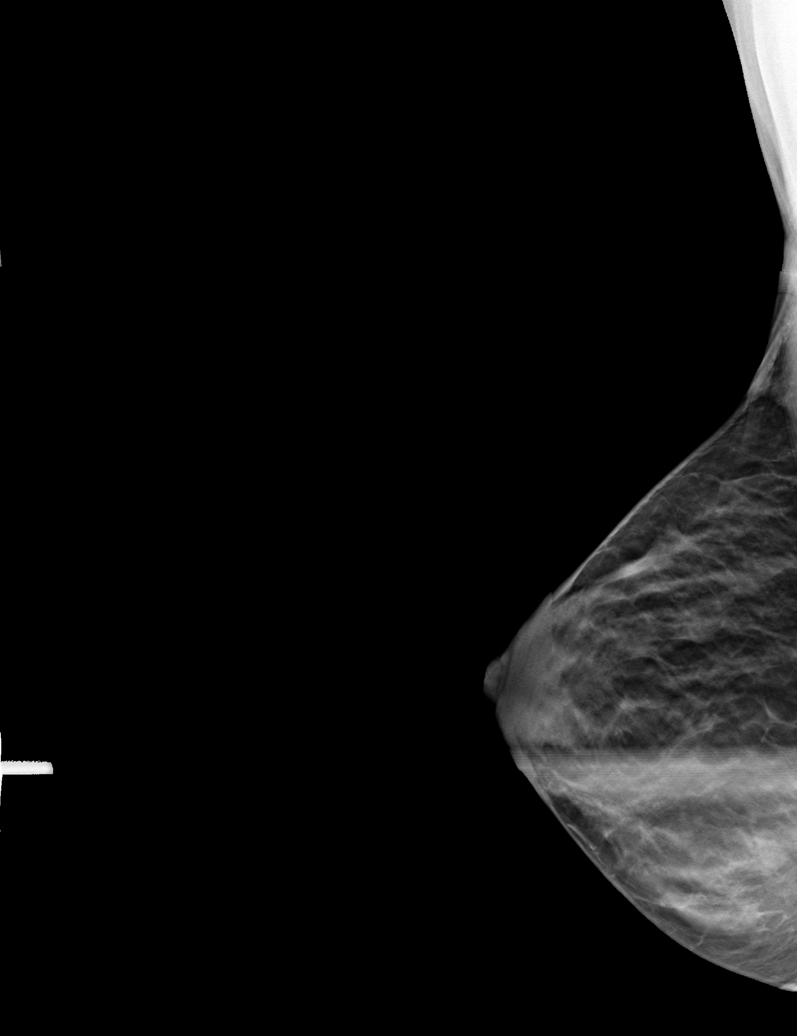

[L ML tomo (2 of 3) · tomo slice 16/31.0]
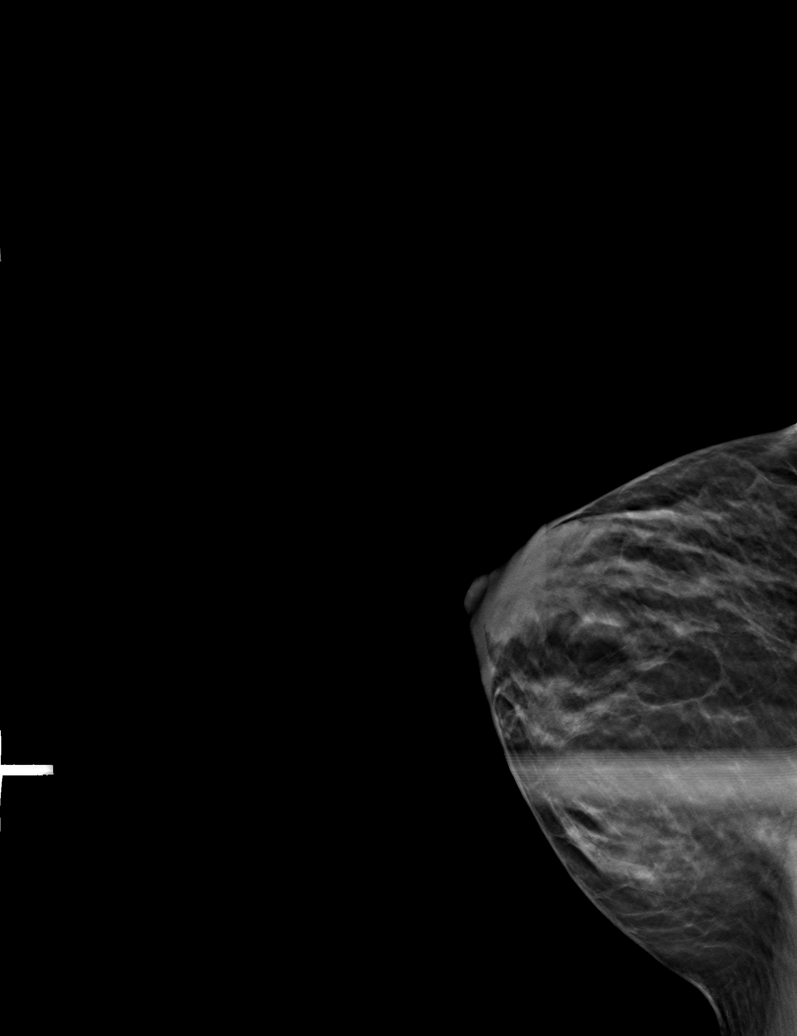

[L ML tomo (3 of 3) · tomo slice 15/30.0]
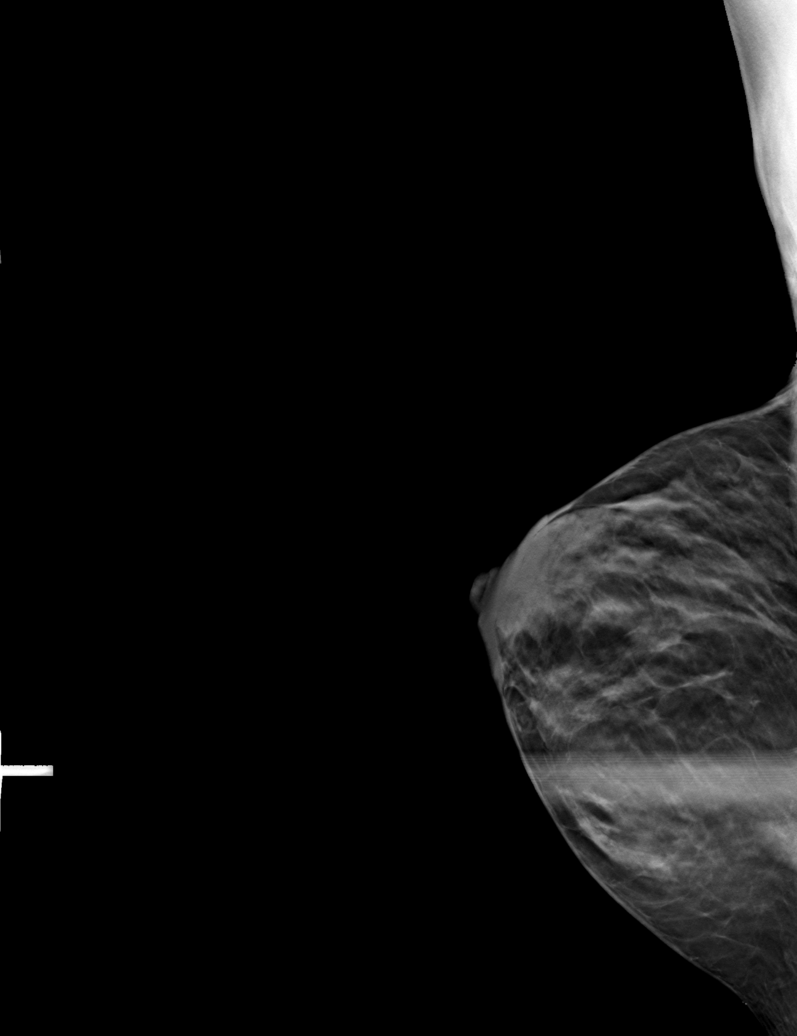

[6 of 17 positions shown; findings below may reference images not displayed]

ACR Breast Density Category c: The breast tissue is heterogeneously
dense, which may obscure small masses.
FINDINGS: Spot-compression magnification images demonstrates a new 8 mm group
of punctate calcifications in the lower-inner left breast, in the
posterior depth.

Mammographic images were processed with CAD.
IMPRESSION: There is an indeterminate 8 mm group of calcifications in the
lower-inner left breast.

RECOMMENDATION:
Stereotactic biopsy is recommended for the left breast
calcifications. This has been scheduled for 11/26/2019 at [DATE] a.m.

I have discussed the findings and recommendations with the patient.
If applicable, a reminder letter will be sent to the patient
regarding the next appointment.

BI-RADS CATEGORY  4: Suspicious.

ADDENDUM:
Patient presents to the [REDACTED] [HOSPITAL] on 11/26/2019 for
stereotactic biopsy of the left breast. Stereotactic biopsy cannot
be performed secondary to the breast compressing to 1.5 cm and not
enough tissue thickness to perform the biopsy. Surgical consultation
with excisional biopsy is recommended.

ADDENDUM:
Surgical consultation has been arranged with Dr. Famata Sores at
[REDACTED] on December 20, 2019.

Edevoqme Bartolac RN on 11/26/2019

*** End of Addendum ***
Addendum:
ACR Breast Density Category c: The breast tissue is heterogeneously
dense, which may obscure small masses.
FINDINGS: Spot-compression magnification images demonstrates a new 8 mm group
of punctate calcifications in the lower-inner left breast, in the
posterior depth.

Mammographic images were processed with CAD.
IMPRESSION: There is an indeterminate 8 mm group of calcifications in the
lower-inner left breast.

RECOMMENDATION:
Stereotactic biopsy is recommended for the left breast
calcifications. This has been scheduled for 11/26/2019 at [DATE] a.m.

I have discussed the findings and recommendations with the patient.
If applicable, a reminder letter will be sent to the patient
regarding the next appointment.

BI-RADS CATEGORY  4: Suspicious.

ADDENDUM:
Patient presents to the [REDACTED] [HOSPITAL] on 11/26/2019 for
stereotactic biopsy of the left breast. Stereotactic biopsy cannot
be performed secondary to the breast compressing to 1.5 cm and not
enough tissue thickness to perform the biopsy. Surgical consultation
with excisional biopsy is recommended.

*** End of Addendum ***
ACR Breast Density Category c: The breast tissue is heterogeneously
dense, which may obscure small masses.
FINDINGS: Spot-compression magnification images demonstrates a new 8 mm group
of punctate calcifications in the lower-inner left breast, in the
posterior depth.

Mammographic images were processed with CAD.
IMPRESSION: There is an indeterminate 8 mm group of calcifications in the
lower-inner left breast.

RECOMMENDATION:
Stereotactic biopsy is recommended for the left breast
calcifications. This has been scheduled for 11/26/2019 at [DATE] a.m.

I have discussed the findings and recommendations with the patient.
If applicable, a reminder letter will be sent to the patient
regarding the next appointment.

BI-RADS CATEGORY  4: Suspicious.

## 2021-12-18 DIAGNOSIS — L509 Urticaria, unspecified: Secondary | ICD-10-CM | POA: Diagnosis not present

## 2021-12-18 DIAGNOSIS — J4521 Mild intermittent asthma with (acute) exacerbation: Secondary | ICD-10-CM | POA: Diagnosis not present

## 2021-12-19 ENCOUNTER — Ambulatory Visit
Admission: RE | Admit: 2021-12-19 | Discharge: 2021-12-19 | Disposition: A | Discharge status: Home / Self Care | Payer: Medicare HMO | Source: Ambulatory Visit | Attending: Internal Medicine | Admitting: Internal Medicine

## 2021-12-19 DIAGNOSIS — Z1231 Encounter for screening mammogram for malignant neoplasm of breast: Secondary | ICD-10-CM | POA: Diagnosis not present

## 2021-12-21 ENCOUNTER — Other Ambulatory Visit: Payer: Self-pay | Admitting: Internal Medicine

## 2021-12-21 DIAGNOSIS — R928 Other abnormal and inconclusive findings on diagnostic imaging of breast: Secondary | ICD-10-CM

## 2021-12-25 ENCOUNTER — Ambulatory Visit
Admission: RE | Admit: 2021-12-25 | Discharge: 2021-12-25 | Disposition: A | Discharge status: Home / Self Care | Payer: Medicare HMO | Source: Ambulatory Visit | Attending: Internal Medicine | Admitting: Internal Medicine

## 2021-12-25 ENCOUNTER — Ambulatory Visit: Payer: Medicare HMO

## 2021-12-25 DIAGNOSIS — R928 Other abnormal and inconclusive findings on diagnostic imaging of breast: Secondary | ICD-10-CM

## 2021-12-25 DIAGNOSIS — N6489 Other specified disorders of breast: Secondary | ICD-10-CM | POA: Diagnosis not present

## 2022-01-25 DIAGNOSIS — H04123 Dry eye syndrome of bilateral lacrimal glands: Secondary | ICD-10-CM | POA: Diagnosis not present

## 2022-01-25 DIAGNOSIS — H2513 Age-related nuclear cataract, bilateral: Secondary | ICD-10-CM | POA: Diagnosis not present

## 2022-01-25 DIAGNOSIS — H524 Presbyopia: Secondary | ICD-10-CM | POA: Diagnosis not present

## 2022-01-31 ENCOUNTER — Encounter: Payer: Self-pay | Admitting: Gastroenterology

## 2022-02-19 ENCOUNTER — Ambulatory Visit (AMBULATORY_SURGERY_CENTER): Payer: Self-pay

## 2022-02-19 VITALS — Ht 62.0 in | Wt 98.0 lb

## 2022-02-19 DIAGNOSIS — Z86010 Personal history of colonic polyps: Secondary | ICD-10-CM

## 2022-02-19 MED ORDER — NA SULFATE-K SULFATE-MG SULF 17.5-3.13-1.6 GM/177ML PO SOLN: 1.0000 | ORAL | 0 refills | Status: DC

## 2022-02-19 NOTE — Progress Notes (Signed)
No egg or soy allergy known to patient  No issues known to pt with past sedation with any surgeries or procedures Patient denies ever being told they had issues or difficulty with intubation  No FH of Malignant Hyperthermia Pt is not on diet pills Pt is not on  home 02  Pt is not on blood thinners  Pt denies issues with constipation  No A fib or A flutter Have any cardiac testing pending--denied Pt instructed to use Single[employer] or GoodRx for a price reduction on prep   

## 2022-03-05 ENCOUNTER — Encounter: Payer: Self-pay | Admitting: Gastroenterology

## 2022-03-19 ENCOUNTER — Ambulatory Visit (AMBULATORY_SURGERY_CENTER): Payer: Medicare HMO | Admitting: Gastroenterology

## 2022-03-19 ENCOUNTER — Telehealth: Payer: Self-pay | Admitting: Gastroenterology

## 2022-03-19 ENCOUNTER — Encounter: Payer: Self-pay | Admitting: Gastroenterology

## 2022-03-19 VITALS — BP 132/75 | HR 85 | Temp 99.1°F | Resp 14 | Ht 62.0 in | Wt 98.0 lb

## 2022-03-19 DIAGNOSIS — F32A Depression, unspecified: Secondary | ICD-10-CM | POA: Diagnosis not present

## 2022-03-19 DIAGNOSIS — Z09 Encounter for follow-up examination after completed treatment for conditions other than malignant neoplasm: Secondary | ICD-10-CM | POA: Diagnosis not present

## 2022-03-19 DIAGNOSIS — F419 Anxiety disorder, unspecified: Secondary | ICD-10-CM | POA: Diagnosis not present

## 2022-03-19 DIAGNOSIS — Z86010 Personal history of colonic polyps: Secondary | ICD-10-CM

## 2022-03-19 DIAGNOSIS — J45909 Unspecified asthma, uncomplicated: Secondary | ICD-10-CM | POA: Diagnosis not present

## 2022-03-19 MED ORDER — SODIUM CHLORIDE 0.9 % IV SOLN: 500.0000 mL | Freq: Once | INTRAVENOUS | Status: DC

## 2022-03-19 NOTE — Progress Notes (Signed)
Vitals-CW  Pt's states no medical or surgical changes since previsit or office visit.   Patient denies allergy to Zoloft.

## 2022-03-19 NOTE — Telephone Encounter (Signed)
Called the patient back to check on her having difficulty with her prep this am. She was able to complete last night but this morning she took 2/3 of the prep and vomited after. She reports that she is having having clear to yellow results. Encouraged her to push fluids until 10:30 her cutoff time.

## 2022-03-19 NOTE — Op Note (Signed)
[facility] Patient Name: Alexis Phillips Procedure Date: 03/19/2022 1:25 PM MRN: [id number] Endoscopist: Ladene Artist , MD Age: 76 Referring MD:  Date of Birth: 10-09-1945 Gender: Female Account #: [id number] Procedure:                Colonoscopy Indications:              Surveillance: Personal history of adenomatous                            polyps on last colonoscopy 5 years ago Medicines:                Monitored Anesthesia Care Procedure:                Pre-Anesthesia Assessment:                           - Prior to the procedure, a History and Physical                            was performed, and patient medications and                            allergies were reviewed. The patient's tolerance of                            previous anesthesia was also reviewed. The risks                            and benefits of the procedure and the sedation                            options and risks were discussed with the patient.                            All questions were answered, and informed consent                            was obtained. Prior Anticoagulants: The patient has                            taken no previous anticoagulant or antiplatelet                            agents. ASA Grade Assessment: II - A patient with                            mild systemic disease. After reviewing the risks                            and benefits, the patient was deemed in                            satisfactory condition to undergo the procedure.  After obtaining informed consent, the colonoscope                            was passed under direct vision. Throughout the                            procedure, the patient's blood pressure, pulse, and                            oxygen saturations were monitored continuously. The                            [id number]-HQ190L Colonoscope was introduced through the                            anus and advanced to  the the cecum, identified by                            appendiceal orifice and ileocecal valve. The                            ileocecal valve, appendiceal orifice, and rectum                            were photographed. The quality of the bowel                            preparation was good. The colonoscopy was performed                            without difficulty. The patient tolerated the                            procedure well. Scope In: 1:35:21 PM Scope Out: 1:49:33 PM Scope Withdrawal Time: 0 hours 9 minutes 10 seconds  Total Procedure Duration: 0 hours 14 minutes 12 seconds  Findings:                 The perianal and digital rectal examinations were                            normal.                           Internal hemorrhoids were found during                            retroflexion. The hemorrhoids were moderate and                            Grade I (internal hemorrhoids that do not prolapse).                           Multiple small-mouthed diverticula were found in  the left colon. There was evidence of diverticular                            spasm. There was no evidence of diverticular                            bleeding.                           The exam was otherwise without abnormality on                            direct and retroflexion views. Complications:            No immediate complications. Estimated blood loss:                            None. Estimated Blood Loss:     Estimated blood loss: none. Impression:               - Internal hemorrhoids.                           - Moderate diverticulosis in the left colon.                           - The examination was otherwise normal on direct                            and retroflexion views.                           - No specimens collected. Recommendation:           - Patient has a contact number available for                            emergencies. The signs and symptoms of  potential                            delayed complications were discussed with the                            patient. Return to normal activities tomorrow.                            Written discharge instructions were provided to the                            patient.                           - High fiber diet.                           - Continue present medications.                           -  No repeat colonoscopy due to age and the absence                            of colonic polyps. Ladene Artist, MD 03/19/2022 1:55:27 PM This report has been signed electronically.

## 2022-03-19 NOTE — Patient Instructions (Signed)
HANDOUTS PROVIDED ON: HIGH FIBER DIET, DIVERTICULOSIS, & HEMORRHOIDS  You may resume your previous diet and medication schedule.  Thank you for allowing [place] to care for you today!!!   YOU HAD AN ENDOSCOPIC PROCEDURE TODAY AT [facility]:   Refer to the procedure report that was given to you for any specific questions about what was found during the examination.  If the procedure report does not answer your questions, please call your gastroenterologist to clarify.  If you requested that your care partner not be given the details of your procedure findings, then the procedure report has been included in a sealed envelope for you to review at your convenience later.  YOU SHOULD EXPECT: Some feelings of bloating in the abdomen. Passage of more gas than usual.  Walking can help get rid of the air that was put into your GI tract during the procedure and reduce the bloating. If you had a lower endoscopy (such as a colonoscopy or flexible sigmoidoscopy) you may notice spotting of blood in your stool or on the toilet paper. If you underwent a bowel prep for your procedure, you may not have a normal bowel movement for a few days.  Please Note:  You might notice some irritation and congestion in your nose or some drainage.  This is from the oxygen used during your procedure.  There is no need for concern and it should clear up in a day or so.  SYMPTOMS TO REPORT IMMEDIATELY:  Following lower endoscopy (colonoscopy or flexible sigmoidoscopy):  Excessive amounts of blood in the stool  Significant tenderness or worsening of abdominal pains  Swelling of the abdomen that is new, acute  Fever of 100F or higher  For urgent or emergent issues, a gastroenterologist can be reached at any hour by calling [phone]. Do not use MyChart messaging for urgent concerns.    DIET:  We do recommend a small meal at first, but then you may proceed to your regular diet.  Drink plenty of fluids but you  should avoid alcoholic beverages for 24 hours.  ACTIVITY:  You should plan to take it easy for the rest of today and you should NOT DRIVE or use heavy machinery until tomorrow (because of the sedation medicines used during the test).    FOLLOW UP: Our staff will call the number listed on your records the next business day following your procedure.  We will call around 7:15- 8:00 am to check on you and address any questions or concerns that you may have regarding the information given to you following your procedure. If we do not reach you, we will leave a message.     If any biopsies were taken you will be contacted by phone or by letter within the next 1-3 weeks.  Please call us at [phone] if you have not heard about the biopsies in 3 weeks.    SIGNATURES/CONFIDENTIALITY: You and/or your care partner have signed paperwork which will be entered into your electronic medical record.  These signatures attest to the fact that that the information above on your After Visit Summary has been reviewed and is understood.  Full responsibility of the confidentiality of this discharge information lies with you and/or your care-partner.

## 2022-03-19 NOTE — Progress Notes (Signed)
History & Physical  Primary Care Physician:  Alexis Hatchet, MD Primary Gastroenterologist: Alexis Edward, MD  CHIEF COMPLAINT:  Personal history of colon polyps   HPI: Alexis Phillips is a 76 y.o. female with a personal history of adenomatous colon polyps for surveillance colonoscopy.   Past Medical History:  Diagnosis Date   Allergy    Anxiety    Asthma    uses inhaler   Depression    Eczema    Family history of genetic disease carrier    sister with pathogenic BRCA1 mutation   Genetic testing 07/24/2016   Test Results: No pathogenic mutations detected  Genes Analyzed: 43 genes on [employer] Common Cancers panel (APC, ATM, AXIN2, BARD1, BMPR1A, BRCA1, BRCA2, BRIP1, CDH1, CDKN2A, CHEK2, DICER1, EPCAM, GREM1, HOXB13, KIT, MEN1, MLH1, MSH2, MSH6, MUTYH, NBN, NF1, PALB2, PDGFRA, PMS2, POLD1, POLE, PTEN, RAD50, RAD51C, RAD51D, SDHA, SDHB, SDHC, SDHD, SMAD4, SMARCA4, STK11, TP53, TSC1, TSC2, VHL).   GERD (gastroesophageal reflux disease)    Lactose intolerance    MRSA infection 2001   right leg    Osteoporosis     Past Surgical History:  Procedure Laterality Date   exploratory lap     RADIOACTIVE SEED GUIDED EXCISIONAL BREAST BIOPSY Left 02/09/2020   Procedure: LEFT BREAST RADIOACTIVE SEED GUIDED EXCISIONAL BREAST BIOPSY;  Surgeon: Alexis Kussmaul, MD;  Location: [place];  Service: General;  Laterality: Left;   TUBAL LIGATION     WISDOM TOOTH EXTRACTION      Prior to Admission medications   Medication Sig Start Date End Date Taking? Authorizing Provider  ALPRAZolam Alexis Phillips) 0.5 MG tablet  12/31/21  Yes [provider]  calcium-vitamin D (OSCAL WITH D) 250-125 MG-UNIT per tablet Take 1 tablet by mouth daily.   Yes [provider]  estradiol (ESTRACE) 0.1 MG/GM vaginal cream Place 1 Applicatorful vaginally 3 (three) times a week.    Yes [provider]  metroNIDAZOLE (METROGEL) 0.75 % gel SMARTSIG:1 sparingly Topical Twice Daily 09/25/21   Yes [provider]  rosuvastatin (CRESTOR) 5 MG tablet Take 5 mg by mouth daily. 11/03/21  Yes [provider]  albuterol (PROVENTIL HFA;VENTOLIN HFA) 108 (90 BASE) MCG/ACT inhaler Inhale 2 puffs into the lungs every 6 (six) hours as needed for wheezing or shortness of breath. 01/03/14   Alexis Koyanagi, MD    Current Outpatient Medications  Medication Sig Dispense Refill   ALPRAZolam (XANAX) 0.5 MG tablet      calcium-vitamin D (OSCAL WITH D) 250-125 MG-UNIT per tablet Take 1 tablet by mouth daily.     estradiol (ESTRACE) 0.1 MG/GM vaginal cream Place 1 Applicatorful vaginally 3 (three) times a week.      metroNIDAZOLE (METROGEL) 0.75 % gel SMARTSIG:1 sparingly Topical Twice Daily     rosuvastatin (CRESTOR) 5 MG tablet Take 5 mg by mouth daily.     albuterol (PROVENTIL HFA;VENTOLIN HFA) 108 (90 BASE) MCG/ACT inhaler Inhale 2 puffs into the lungs every 6 (six) hours as needed for wheezing or shortness of breath. 1 Inhaler 5   Current Facility-Administered Medications  Medication Dose Route Frequency Provider Last Rate Last Admin   0.9 %  sodium chloride infusion  500 mL Intravenous Continuous Alexis Artist, MD       0.9 %  sodium chloride infusion  500 mL Intravenous Once Alexis Artist, MD        Allergies as of 03/19/2022 - Review Complete 03/19/2022  Allergen Reaction Noted   Aspirin  01/12/2012   Codeine  01/12/2012   Ibandronic acid  01/12/2012   Sertraline  09/16/2016   Amoxicillin-pot clavulanate Rash 01/12/2012   Sulfamethoxazole-trimethoprim Rash 01/12/2012    Family History  Problem Relation Age of Onset   Prostate cancer Father 54       deceased 44   Breast cancer Sister 29       BRCA1 mutation [id number]   Cancer Brother        throat ca; deceased 61   Cancer Paternal Uncle        3 pat uncles; unsure of types of cancers   Colon cancer Neg Hx    Esophageal cancer Neg Hx    Pancreatic cancer Neg Hx    Rectal cancer Neg Hx     Stomach cancer Neg Hx    Colon polyps Neg Hx     Social History   Socioeconomic History   Marital status: Single    Spouse name: Not on file   Number of children: Not on file   Years of education: Not on file   Highest education level: Not on file  Occupational History   Not on file  Tobacco Use   Smoking status: Never   Smokeless tobacco: Never  Vaping Use   Vaping Use: Never used  Substance and Sexual Activity   Alcohol use: No   Drug use: No   Sexual activity: Not on file  Other Topics Concern   Not on file  Social History Narrative   Not on file   Social Determinants of Health   Financial Resource Strain: Not on file  Food Insecurity: Not on file  Transportation Needs: Not on file  Physical Activity: Not on file  Stress: Not on file  Social Connections: Not on file  Intimate Partner Violence: Not on file    Review of Systems:  All systems reviewed were negative except where noted in HPI.   Physical Exam: General:  Alert, well-developed, in NAD Head:  Normocephalic and atraumatic. Eyes:  Sclera clear, no icterus.   Conjunctiva pink. Ears:  Normal auditory acuity. Mouth:  No deformity or lesions.  Neck:  Supple; no masses . Lungs:  Clear throughout to auscultation.   No wheezes, crackles, or rhonchi. No acute distress. Heart:  Regular rate and rhythm; no murmurs. Abdomen:  Soft, nondistended, nontender. No masses, hepatomegaly. No obvious masses.  Normal bowel .    Rectal:  Deferred   Msk:  Symmetrical without gross deformities.. Pulses:  Normal pulses noted. Extremities:  Without edema. Neurologic:  Alert and  oriented x4;  grossly normal neurologically. Skin:  Intact without significant lesions or rashes. Cervical Nodes:  No significant cervical adenopathy. Psych:  Alert and cooperative. Normal mood and affect.   Impression / Plan:   Personal history of adenomatous colon polyps for surveillance colonoscopy.  Alexis Phillips. Alexis Phillips Plan  03/19/2022, 1:32  PM See [facility], [facility], to contact our on call provider

## 2022-03-19 NOTE — Progress Notes (Signed)
To pacu, VSS. Report to [email or web address].tb 

## 2022-03-19 NOTE — Telephone Encounter (Signed)
Inbound call from patient stating she is having trouble with consuming the prep medication. Please give a call to further advise.  Thank you

## 2022-03-20 ENCOUNTER — Telehealth: Payer: Self-pay

## 2022-03-20 NOTE — Telephone Encounter (Signed)
  Follow up Call-     03/19/2022   12:41 PM 03/19/2022   12:38 PM  Call back number  Post procedure Call Back phone  # [phone]   Permission to leave phone message  Yes     Patient questions:  Do you have a fever, pain , or abdominal swelling? No. Pain Score  0 *  Have you tolerated food without any problems? Yes.    Have you been able to return to your normal activities? Yes.    Do you have any questions about your discharge instructions: Diet   No. Medications  No. Follow up visit  No.  Do you have questions or concerns about your Care? No.  Actions: * If pain score is 4 or above: No action needed, pain <4.

## 2022-07-19 DIAGNOSIS — H04123 Dry eye syndrome of bilateral lacrimal glands: Secondary | ICD-10-CM | POA: Diagnosis not present

## 2022-07-19 DIAGNOSIS — H2513 Age-related nuclear cataract, bilateral: Secondary | ICD-10-CM | POA: Diagnosis not present

## 2022-07-23 DIAGNOSIS — E785 Hyperlipidemia, unspecified: Secondary | ICD-10-CM | POA: Diagnosis not present

## 2022-07-23 DIAGNOSIS — R928 Other abnormal and inconclusive findings on diagnostic imaging of breast: Secondary | ICD-10-CM | POA: Diagnosis not present

## 2022-07-23 DIAGNOSIS — R0789 Other chest pain: Secondary | ICD-10-CM | POA: Diagnosis not present

## 2022-07-23 DIAGNOSIS — R002 Palpitations: Secondary | ICD-10-CM | POA: Diagnosis not present

## 2022-07-23 DIAGNOSIS — I2584 Coronary atherosclerosis due to calcified coronary lesion: Secondary | ICD-10-CM | POA: Diagnosis not present

## 2022-07-23 DIAGNOSIS — F419 Anxiety disorder, unspecified: Secondary | ICD-10-CM | POA: Diagnosis not present

## 2022-08-20 DIAGNOSIS — F419 Anxiety disorder, unspecified: Secondary | ICD-10-CM | POA: Diagnosis not present

## 2022-08-20 DIAGNOSIS — M810 Age-related osteoporosis without current pathological fracture: Secondary | ICD-10-CM | POA: Diagnosis not present

## 2022-08-20 DIAGNOSIS — E785 Hyperlipidemia, unspecified: Secondary | ICD-10-CM | POA: Diagnosis not present

## 2022-08-20 DIAGNOSIS — R7989 Other specified abnormal findings of blood chemistry: Secondary | ICD-10-CM | POA: Diagnosis not present

## 2022-08-27 DIAGNOSIS — I2584 Coronary atherosclerosis due to calcified coronary lesion: Secondary | ICD-10-CM | POA: Diagnosis not present

## 2022-08-27 DIAGNOSIS — F902 Attention-deficit hyperactivity disorder, combined type: Secondary | ICD-10-CM | POA: Diagnosis not present

## 2022-08-27 DIAGNOSIS — M545 Low back pain, unspecified: Secondary | ICD-10-CM | POA: Diagnosis not present

## 2022-08-27 DIAGNOSIS — M810 Age-related osteoporosis without current pathological fracture: Secondary | ICD-10-CM | POA: Diagnosis not present

## 2022-08-27 DIAGNOSIS — Z0000 Encounter for general adult medical examination without abnormal findings: Secondary | ICD-10-CM | POA: Diagnosis not present

## 2022-08-27 DIAGNOSIS — F419 Anxiety disorder, unspecified: Secondary | ICD-10-CM | POA: Diagnosis not present

## 2022-08-27 DIAGNOSIS — Z86010 Personal history of colonic polyps: Secondary | ICD-10-CM | POA: Diagnosis not present

## 2022-08-27 DIAGNOSIS — E785 Hyperlipidemia, unspecified: Secondary | ICD-10-CM | POA: Diagnosis not present

## 2022-11-18 ENCOUNTER — Other Ambulatory Visit: Payer: Self-pay | Admitting: Internal Medicine

## 2022-11-18 DIAGNOSIS — F419 Anxiety disorder, unspecified: Secondary | ICD-10-CM | POA: Diagnosis not present

## 2022-11-18 DIAGNOSIS — G44209 Tension-type headache, unspecified, not intractable: Secondary | ICD-10-CM | POA: Diagnosis not present

## 2022-11-18 DIAGNOSIS — Z1231 Encounter for screening mammogram for malignant neoplasm of breast: Secondary | ICD-10-CM

## 2022-11-18 DIAGNOSIS — M542 Cervicalgia: Secondary | ICD-10-CM | POA: Diagnosis not present

## 2022-12-24 ENCOUNTER — Ambulatory Visit
Admission: RE | Admit: 2022-12-24 | Discharge: 2022-12-24 | Disposition: A | Discharge status: Home / Self Care | Payer: Medicare HMO | Source: Ambulatory Visit | Attending: Internal Medicine | Admitting: Internal Medicine

## 2022-12-24 DIAGNOSIS — Z1231 Encounter for screening mammogram for malignant neoplasm of breast: Secondary | ICD-10-CM

## 2023-01-21 DIAGNOSIS — H353132 Nonexudative age-related macular degeneration, bilateral, intermediate dry stage: Secondary | ICD-10-CM | POA: Diagnosis not present

## 2023-01-21 DIAGNOSIS — H5203 Hypermetropia, bilateral: Secondary | ICD-10-CM | POA: Diagnosis not present

## 2023-01-21 DIAGNOSIS — H2513 Age-related nuclear cataract, bilateral: Secondary | ICD-10-CM | POA: Diagnosis not present

## 2023-03-29 ENCOUNTER — Emergency Department (HOSPITAL_COMMUNITY)
Admission: EM | Admit: 2023-03-29 | Discharge: 2023-03-29 | Disposition: A | Discharge status: Home / Self Care | Payer: Medicare HMO | Attending: Emergency Medicine | Admitting: Emergency Medicine

## 2023-03-29 ENCOUNTER — Emergency Department (HOSPITAL_COMMUNITY): Payer: Medicare HMO

## 2023-03-29 ENCOUNTER — Other Ambulatory Visit: Payer: Self-pay

## 2023-03-29 ENCOUNTER — Encounter (HOSPITAL_COMMUNITY): Payer: Self-pay

## 2023-03-29 DIAGNOSIS — I714 Abdominal aortic aneurysm, without rupture, unspecified: Secondary | ICD-10-CM | POA: Diagnosis not present

## 2023-03-29 DIAGNOSIS — I10 Essential (primary) hypertension: Secondary | ICD-10-CM | POA: Diagnosis not present

## 2023-03-29 DIAGNOSIS — I728 Aneurysm of other specified arteries: Secondary | ICD-10-CM | POA: Insufficient documentation

## 2023-03-29 DIAGNOSIS — R531 Weakness: Secondary | ICD-10-CM | POA: Diagnosis not present

## 2023-03-29 DIAGNOSIS — R0902 Hypoxemia: Secondary | ICD-10-CM | POA: Diagnosis not present

## 2023-03-29 DIAGNOSIS — R55 Syncope and collapse: Secondary | ICD-10-CM | POA: Insufficient documentation

## 2023-03-29 DIAGNOSIS — K5730 Diverticulosis of large intestine without perforation or abscess without bleeding: Secondary | ICD-10-CM | POA: Diagnosis not present

## 2023-03-29 DIAGNOSIS — R110 Nausea: Secondary | ICD-10-CM | POA: Diagnosis not present

## 2023-03-29 DIAGNOSIS — R109 Unspecified abdominal pain: Secondary | ICD-10-CM | POA: Insufficient documentation

## 2023-03-29 LAB — URINALYSIS, W/ REFLEX TO CULTURE (INFECTION SUSPECTED)
Bacteria, UA: NONE SEEN
Bilirubin Urine: NEGATIVE
Glucose, UA: NEGATIVE mg/dL
Ketones, ur: 5 mg/dL — AB
Leukocytes,Ua: NEGATIVE
Nitrite: NEGATIVE
Protein, ur: NEGATIVE mg/dL
Specific Gravity, Urine: 1.01 (ref 1.005–1.030)
pH: 7 (ref 5.0–8.0)

## 2023-03-29 LAB — CBC WITH DIFFERENTIAL/PLATELET
Abs Immature Granulocytes: 0.02 10*3/uL (ref 0.00–0.07)
Basophils Absolute: 0.1 10*3/uL (ref 0.0–0.1)
Basophils Relative: 1 %
Eosinophils Absolute: 0.2 10*3/uL (ref 0.0–0.5)
Eosinophils Relative: 4 %
HCT: 43.2 % (ref 36.0–46.0)
Hemoglobin: 14.4 g/dL (ref 12.0–15.0)
Immature Granulocytes: 0 %
Lymphocytes Relative: 23 %
Lymphs Abs: 1.3 10*3/uL (ref 0.7–4.0)
MCH: 32.2 pg (ref 26.0–34.0)
MCHC: 33.3 g/dL (ref 30.0–36.0)
MCV: 96.6 fL (ref 80.0–100.0)
Monocytes Absolute: 0.4 10*3/uL (ref 0.1–1.0)
Monocytes Relative: 7 %
Neutro Abs: 3.7 10*3/uL (ref 1.7–7.7)
Neutrophils Relative %: 65 %
Platelets: 229 10*3/uL (ref 150–400)
RBC: 4.47 MIL/uL (ref 3.87–5.11)
RDW: 12.8 % (ref 11.5–15.5)
WBC: 5.6 10*3/uL (ref 4.0–10.5)
nRBC: 0 % (ref 0.0–0.2)

## 2023-03-29 LAB — COMPREHENSIVE METABOLIC PANEL
ALT: 12 U/L (ref 0–44)
AST: 25 U/L (ref 15–41)
Albumin: 4.1 g/dL (ref 3.5–5.0)
Alkaline Phosphatase: 52 U/L (ref 38–126)
Anion gap: 7 (ref 5–15)
BUN: 21 mg/dL (ref 8–23)
CO2: 27 mmol/L (ref 22–32)
Calcium: 8.8 mg/dL — ABNORMAL LOW (ref 8.9–10.3)
Chloride: 104 mmol/L (ref 98–111)
Creatinine, Ser: 0.39 mg/dL — ABNORMAL LOW (ref 0.44–1.00)
GFR, Estimated: >60 mL/min (ref >60)
Glucose, Bld: 89 mg/dL (ref 70–99)
Potassium: 4.4 mmol/L (ref 3.5–5.1)
Sodium: 138 mmol/L (ref 135–145)
Total Bilirubin: 1.1 mg/dL (ref 0.3–1.2)
Total Protein: 7 g/dL (ref 6.5–8.1)

## 2023-03-29 LAB — TROPONIN I (HIGH SENSITIVITY)
Troponin I (High Sensitivity): <2 ng/L (ref <18)
Troponin I (High Sensitivity): 3 ng/L (ref <18)

## 2023-03-29 LAB — POTASSIUM: Potassium: 4.2 mmol/L (ref 3.5–5.1)

## 2023-03-29 LAB — LIPASE, BLOOD: Lipase: 54 U/L — ABNORMAL HIGH (ref 11–51)

## 2023-03-29 LAB — MAGNESIUM: Magnesium: 2.3 mg/dL (ref 1.7–2.4)

## 2023-03-29 MED ORDER — IOHEXOL 300 MG/ML  SOLN
100.0000 mL | Freq: Once | INTRAMUSCULAR | Status: AC | PRN
  Administered 2023-03-29: 100 mL via INTRAVENOUS

## 2023-03-29 MED ORDER — LACTATED RINGERS IV BOLUS
500.0000 mL | Freq: Once | INTRAVENOUS | Status: AC
  Administered 2023-03-29: 500 mL via INTRAVENOUS

## 2023-03-29 NOTE — ED Provider Notes (Signed)
[place] EMERGENCY DEPARTMENT AT [facility] Provider Note   CSN: [id number] Arrival date & time: 03/29/23  4315     History  Chief Complaint  Patient presents with   Nausea   Near Syncope    Alexis Phillips is a 77 y.o. female.  HPI 77 year old female presents with weakness and near syncope.  She has some chronic abdominal issues but states over the last couple days she has had more nausea than normal and a little more pain than normal.  The pain is more on her left side of her abdomen.  She has not been vomiting.  She has increased bowel movements over the last few days though not diarrhea.  She states she carries a lot of stress due to caregiving for her partner who had a stroke several years ago.  She states that today she was at the stove and while she was standing there she felt like her legs were heavy and she felt weak all over and lightheaded like she was going to pass out.  She did not pass out and was able to ambulate but had to sit down.  She felt like her heart was beating faster/having some palpitations and she felt a little bit of pressure in her chest from this.  She denies any shortness of breath, thunderclap headache, or focal weakness/numbness.  Home Medications Prior to Admission medications   Medication Sig Start Date End Date Taking? Authorizing Provider  albuterol (PROVENTIL HFA;VENTOLIN HFA) 108 (90 BASE) MCG/ACT inhaler Inhale 2 puffs into the lungs every 6 (six) hours as needed for wheezing or shortness of breath. 01/03/14   Tonye Pearson, MD  ALPRAZolam Prudy Feeler) 0.5 MG tablet  12/31/21   [provider]  calcium-vitamin D (OSCAL WITH D) 250-125 MG-UNIT per tablet Take 1 tablet by mouth daily.    [provider]  estradiol (ESTRACE) 0.1 MG/GM vaginal cream Place 1 Applicatorful vaginally 3 (three) times a week.     [provider]  metroNIDAZOLE (METROGEL) 0.75 % gel SMARTSIG:1 sparingly Topical Twice Daily 09/25/21    [provider]  rosuvastatin (CRESTOR) 5 MG tablet Take 5 mg by mouth daily. 11/03/21   [provider]      Allergies    Aspirin, Codeine, Ibandronic acid, Sertraline, Amoxicillin-pot clavulanate, and Sulfamethoxazole-trimethoprim    Review of Systems   Review of Systems  Respiratory:  Negative for shortness of breath.   Gastrointestinal:  Positive for abdominal pain and nausea. Negative for vomiting.  Neurological:  Positive for light-headedness. Negative for syncope.    Physical Exam Updated Vital Signs BP 138/86   Pulse 77   Temp (!) 97.5 F (36.4 C) (Oral)   Resp 18   Ht 5\' 2"  (1.575 m)   Wt 44 kg   SpO2 100%   BMI 17.74 kg/m  Physical Exam Vitals and nursing note reviewed.  Constitutional:      Appearance: She is well-developed.  HENT:     Head: Normocephalic and atraumatic.  Eyes:     Extraocular Movements: Extraocular movements intact.     Pupils: Pupils are equal, round, and reactive to light.  Cardiovascular:     Rate and Rhythm: Normal rate and regular rhythm.     Heart sounds: Normal heart sounds.  Pulmonary:     Effort: Pulmonary effort is normal.     Breath sounds: Normal breath sounds.  Abdominal:     Palpations: Abdomen is soft.     Tenderness: There is  abdominal tenderness (mild, more left sided than right).  Skin:    General: Skin is warm and dry.  Neurological:     Mental Status: She is alert.     Comments: CN 3-12 grossly intact. 5/5 strength in all 4 extremities. Grossly normal sensation. Normal finger to nose.      ED Results / Procedures / Treatments   Labs (all labs ordered are listed, but only abnormal results are displayed) Labs Reviewed  URINALYSIS, W/ REFLEX TO CULTURE (INFECTION SUSPECTED) - Abnormal; Notable for the following components:      Result Value   Color, Urine STRAW (*)    Hgb urine dipstick SMALL (*)    Ketones, ur 5 (*)    All other components within normal limits  COMPREHENSIVE METABOLIC PANEL -  Abnormal; Notable for the following components:   Creatinine, Ser 0.39 (*)    Calcium 8.8 (*)    All other components within normal limits  LIPASE, BLOOD - Abnormal; Notable for the following components:   Lipase 54 (*)    All other components within normal limits  CBC WITH DIFFERENTIAL/PLATELET  MAGNESIUM  POTASSIUM  TROPONIN I (HIGH SENSITIVITY)  TROPONIN I (HIGH SENSITIVITY)    EKG EKG Interpretation Date/Time:  Saturday March 29 2023 12:36:46 EDT Ventricular Rate:  76 PR Interval:  180 QRS Duration:  89 QT Interval:  383 QTC Calculation: 431 R Axis:   63  Text Interpretation: Sinus rhythm RSR' in V1 or V2, probably normal variant no acute ST/T changes Confirmed by Pricilla Loveless [phone]) on 03/29/2023 12:39:03 PM  Radiology CT ABDOMEN PELVIS W CONTRAST  Result Date: 03/29/2023 CLINICAL DATA:  77 year old female with history of acute onset of nonlocalized abdominal pain. EXAM: CT ABDOMEN AND PELVIS WITH CONTRAST TECHNIQUE: Multidetector CT imaging of the abdomen and pelvis was performed using the standard protocol following bolus administration of intravenous contrast. RADIATION DOSE REDUCTION: This exam was performed according to the departmental dose-optimization program which includes automated exposure control, adjustment of the mA and/or kV according to patient size and/or use of iterative reconstruction technique. CONTRAST:  OMNIPAQUE IOHEXOL 300 MG/ML  SOLN COMPARISON:  No priors. FINDINGS: Lower chest: Mild scarring or atelectasis in the lower lobes of the lungs bilaterally. Hepatobiliary: No suspicious cystic or solid hepatic lesions. No intra or extrahepatic biliary ductal dilatation. Gallbladder is unremarkable in appearance. Pancreas: No pancreatic mass. No pancreatic ductal dilatation. No pancreatic or peripancreatic fluid collections or inflammatory changes. Spleen: Unremarkable. Adrenals/Urinary Tract: No suspicious renal lesions. No hydroureteronephrosis.  Urinary bladder is normal in appearance. Bilateral adrenal glands are normal in appearance. Stomach/Bowel: The appearance of the stomach is normal. No pathologic dilatation of small bowel or colon. Numerous colonic diverticula are noted, without surrounding inflammatory changes to suggest an acute diverticulitis at this time. Normal appendix. Vascular/Lymphatic: Aortic atherosclerosis. Multifocal aneurysmal dilatation of the splenic artery which measures up to 1 cm in diameter proximally and 1.2 cm in diameter distally. No lymphadenopathy noted in the abdomen or pelvis. Reproductive: Uterus and ovaries are unremarkable in appearance. Other: No significant volume of ascites.  No pneumoperitoneum. Musculoskeletal: There are no aggressive appearing lytic or blastic lesions noted in the visualized portions of the skeleton. IMPRESSION: 1. No acute findings are noted in the abdomen or pelvis to account for the patient's symptoms. 2. Aortic atherosclerosis. There is also atherosclerosis of the splenic artery with 2 splenic artery aneurysms, measuring up to 1.2 cm in diameter distally, as above. 3. Colonic diverticulosis without evidence of acute  diverticulitis at this time. Electronically Signed   By: Trudie Reed M.D.   On: 03/29/2023 12:51   DG Chest 2 View  Result Date: 03/29/2023 CLINICAL DATA:  Near-syncope EXAM: CHEST - 2 VIEW COMPARISON:  Chest x-ray dated April 20, 2007 FINDINGS: The heart size and mediastinal contours are within normal limits. Calcifications of the costochondral junctions. Both lungs are clear. The visualized skeletal structures are unremarkable. IMPRESSION: No active cardiopulmonary disease. Electronically Signed   By: Allegra Lai M.D.   On: 03/29/2023 10:32    Procedures Procedures    Medications Ordered in ED Medications  lactated ringers bolus 500 mL (0 mLs Intravenous Stopped 03/29/23 1449)  iohexol (OMNIPAQUE) 300 MG/ML solution 100 mL (100 mLs Intravenous Contrast  Given 03/29/23 1138)    ED Course/ Medical Decision Making/ A&P                                 Medical Decision Making Amount and/or Complexity of Data Reviewed Labs: ordered.    Details: No significant lab abnormality including normal troponins x 2. Radiology: ordered and independent interpretation performed.    Details: No bowel obstruction ECG/medicine tests: ordered and independent interpretation performed.    Details: No ischemia  Risk Prescription drug management.   Patient presents with generalized weakness and near syncope.  She is feeling better here and was given a small bolus of fluid.  No significant finding on workup.  No chest pain though she did have some brief mild pressure but has a negative workup including negative troponins, EKG, etc.  Low suspicion she had a PE.  Her symptoms essentially are resolved.  She has some chronic nausea and chronic left-sided abdominal pain but no clear findings on CT.  There is this evidence of possible splenic artery aneurysm but there is no signs of ischemia to the spleen.  I do not think this is causing any acute abnormalities but will refer her to vascular surgery.  Otherwise, she appears stable for discharge home with return precautions and follow-up with PCP and vascular.        Final Clinical Impression(s) / ED Diagnoses Final diagnoses:  Near syncope  Splenic artery aneurysm [facility])    Rx / DC Orders ED Discharge Orders     None         Pricilla Loveless, MD 03/29/23 1503

## 2023-03-29 NOTE — ED Triage Notes (Signed)
Patient BIB EMS for near syncope. Patient reports nausea since yesterday. This AM she had sudden episode of being hot, dizzy, and nauseous. On EMS arrival patient's BP was 180/100. Patient has no hx of HTN.

## 2023-03-29 NOTE — Discharge Instructions (Addendum)
Follow up with your primary care provider in regards to your weakness and nearly passing out.  Be sure to drink an appropriate amount of fluids.  Your CT scan showed an aneurysm of your splenic artery.  You should follow-up with vascular surgery and have been given the referral number.  Call their office on Monday for an outpatient follow-up.  If you develop new or worsening abdominal pain, trouble breathing, chest pain, feeling like you are going to pass out, or any other new/concerning symptoms then return to the ER or call 911.

## 2023-04-03 DIAGNOSIS — K5790 Diverticulosis of intestine, part unspecified, without perforation or abscess without bleeding: Secondary | ICD-10-CM | POA: Diagnosis not present

## 2023-04-03 DIAGNOSIS — F419 Anxiety disorder, unspecified: Secondary | ICD-10-CM | POA: Diagnosis not present

## 2023-04-03 DIAGNOSIS — I728 Aneurysm of other specified arteries: Secondary | ICD-10-CM | POA: Diagnosis not present

## 2023-04-03 DIAGNOSIS — R110 Nausea: Secondary | ICD-10-CM | POA: Diagnosis not present

## 2023-04-03 DIAGNOSIS — R55 Syncope and collapse: Secondary | ICD-10-CM | POA: Diagnosis not present

## 2023-04-17 ENCOUNTER — Ambulatory Visit: Payer: Medicare HMO | Admitting: Vascular Surgery

## 2023-04-17 ENCOUNTER — Encounter: Payer: Self-pay | Admitting: Vascular Surgery

## 2023-04-17 VITALS — BP 148/78 | HR 74 | Temp 97.9°F | Resp 16 | Ht 62.0 in | Wt 96.5 lb

## 2023-04-17 DIAGNOSIS — I728 Aneurysm of other specified arteries: Secondary | ICD-10-CM | POA: Diagnosis not present

## 2023-04-17 NOTE — Progress Notes (Signed)
Office Note     CC: Splenic artery aneurysms Requesting Provider:  Alysia Penna, MD  HPI: Alexis Phillips is a 77 y.o. (10/07/1945) female presenting at the request of .Alysia Penna, MD for evaluation of splenic artery aneurysms.  On exam, Alexis Phillips was doing well.  Originally from [place], she has lived in [place] for over 50 years.  Prior to retirement she worked for an [employer], [employer].  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  She continues to live an active, independent lifestyle.  Notes her activity level is most affected by chronic back issues, as well as chronic, intermittent abdominal pain due to IBS diagnosis.   Alexis Phillips was recently seen in the emergency department with severe abdominal pain.  This resolved by the time she had her CT scan.  CT scan demonstrated incidental finding of 2, 1.2 cm splenic artery aneurysms.  Vascular surgery follow-up was scheduled. She denies recent issues with new onset back pain, chest pain, abdominal pain.    Past Medical History:  Diagnosis Date   Allergy    Anxiety    Asthma    uses inhaler   Depression    Eczema    Family history of genetic disease carrier    sister with pathogenic BRCA1 mutation   Genetic testing 07/24/2016   Test Results: No pathogenic mutations detected  Genes Analyzed: 43 genes on [employer] Common Cancers panel (APC, ATM, AXIN2, BARD1, BMPR1A, BRCA1, BRCA2, BRIP1, CDH1, CDKN2A, CHEK2, DICER1, EPCAM, GREM1, HOXB13, KIT, MEN1, MLH1, MSH2, MSH6, MUTYH, NBN, NF1, PALB2, PDGFRA, PMS2, POLD1, POLE, PTEN, RAD50, RAD51C, RAD51D, SDHA, SDHB, SDHC, SDHD, SMAD4, SMARCA4, STK11, TP53, TSC1, TSC2, VHL).   GERD (gastroesophageal reflux disease)    Lactose intolerance    MRSA infection 2001   right leg    Osteoporosis     Past Surgical History:  Procedure Laterality Date   exploratory lap     RADIOACTIVE SEED GUIDED EXCISIONAL BREAST BIOPSY Left 02/09/2020    Procedure: LEFT BREAST RADIOACTIVE SEED GUIDED EXCISIONAL BREAST BIOPSY;  Surgeon: Griselda Miner, MD;  Location: [facility];  Service: General;  Laterality: Left;   TUBAL LIGATION     WISDOM TOOTH EXTRACTION      Social History   Socioeconomic History   Marital status: Single    Spouse name: Not on file   Number of children: Not on file   Years of education: Not on file   Highest education level: Not on file  Occupational History   Not on file  Tobacco Use   Smoking status: Never   Smokeless tobacco: Never  Vaping Use   Vaping status: Never Used  Substance and Sexual Activity   Alcohol use: No   Drug use: No   Sexual activity: Not on file  Other Topics Concern   Not on file  Social History Narrative   Not on file   Social Determinants of Health   Financial Resource Strain: Not on file  Food Insecurity: Not on file  Transportation Needs: Not on file  Physical Activity: Not on file  Stress: Not on file  Social Connections: Not on file  Intimate Partner Violence: Not on file   Family History  Problem Relation Age of Onset   Prostate cancer Father 36       deceased 51   Breast cancer Sister 17       BRCA1 mutation [id number]   Cancer Brother  throat ca; deceased 57   Cancer Paternal Uncle        3 pat uncles; unsure of types of cancers   Colon cancer Neg Hx    Esophageal cancer Neg Hx    Pancreatic cancer Neg Hx    Rectal cancer Neg Hx    Stomach cancer Neg Hx    Colon polyps Neg Hx     Current Outpatient Medications  Medication Sig Dispense Refill   albuterol (PROVENTIL HFA;VENTOLIN HFA) 108 (90 BASE) MCG/ACT inhaler Inhale 2 puffs into the lungs every 6 (six) hours as needed for wheezing or shortness of breath. 1 Inhaler 5   ALPRAZolam (XANAX) 0.5 MG tablet      calcium-vitamin D (OSCAL WITH D) 250-125 MG-UNIT per tablet Take 1 tablet by mouth daily.     estradiol (ESTRACE) 0.1 MG/GM vaginal cream Place 1 Applicatorful  vaginally 3 (three) times a week.      metroNIDAZOLE (METROGEL) 0.75 % gel SMARTSIG:1 sparingly Topical Twice Daily     rosuvastatin (CRESTOR) 5 MG tablet Take 5 mg by mouth daily.     No current facility-administered medications for this visit.    Allergies  Allergen Reactions   Aspirin     Asthma exacerbation    Codeine     Nausea  Other reaction(s): Vomiting, Hallucination   Ibandronic Acid     Bone and chest pain Other reaction(s): bone pain   Sertraline     Other reaction(s): carb cravings , weight gain   Amoxicillin-Pot Clavulanate Rash    Rash    Sulfamethoxazole-Trimethoprim Rash    rash     REVIEW OF SYSTEMS:  [X]  denotes positive finding, [ ]  denotes negative finding Cardiac  Comments:  Chest pain or chest pressure:    Shortness of breath upon exertion:    Short of breath when lying flat:    Irregular heart rhythm:        Vascular    Pain in calf, thigh, or hip brought on by ambulation:    Pain in feet at night that wakes you up from your sleep:     Blood clot in your veins:    Leg swelling:         Pulmonary    Oxygen at home:    Productive cough:     Wheezing:         Neurologic    Sudden weakness in arms or legs:     Sudden numbness in arms or legs:     Sudden onset of difficulty speaking or slurred speech:    Temporary loss of vision in one eye:     Problems with dizziness:         Gastrointestinal    Blood in stool:     Vomited blood:         Genitourinary    Burning when urinating:     Blood in urine:        Psychiatric    Major depression:         Hematologic    Bleeding problems:    Problems with blood clotting too easily:        Skin    Rashes or ulcers:        Constitutional    Fever or chills:      PHYSICAL EXAMINATION:  Vitals:   04/17/23 1053  BP: (!) 148/78  Pulse: 74  Resp: 16  Temp: 97.9 F (36.6 C)  SpO2: 96%  Weight: 96 lb 8  oz (43.8 kg)  Height: 5\' 2"  (1.575 m)    General:  WDWN in NAD; vital signs  documented above Gait: Not observed HENT: WNL, normocephalic Pulmonary: normal non-labored breathing , without wheezing Cardiac: regular HR Abdomen: soft, NT, no masses Skin: without rashes Vascular Exam/Pulses:  Right Left  Radial 2+ (normal) 2+ (normal)                       Extremities: without ischemic changes, without Gangrene , without cellulitis; without open wounds;  Musculoskeletal: no muscle wasting or atrophy  Neurologic: A&O X 3;  No focal weakness or paresthesias are detected Psychiatric:  The pt has Normal affect.   Non-Invasive Vascular Imaging:   IMPRESSION: 1. No acute findings are noted in the abdomen or pelvis to account for the patient's symptoms. 2. Aortic atherosclerosis. There is also atherosclerosis of the splenic artery with 2 splenic artery aneurysms, measuring up to 1.2 cm in diameter distally, as above. 3. Colonic diverticulosis without evidence of acute diverticulitis at this time.    ASSESSMENT/PLAN: RASHEIDA BRODEN is a 77 y.o. female presenting with initial finding of 2, 1.2 cm splenic artery aneurysms.  I personally evaluated the CT scan.  These aneurysms are small, and calcified.  I think that may have likely been present for a number of years.  Splenic artery aneurysms are classically defined as artery diameter greater than 1 cm.  Indications for repair are greater than 3 cm, childbearing age, liver transplant.   Dean and I had a large discussion regarding the above.  She would benefit from continued observation at this time.  My plan is to see her in 1 years time with duplex ultrasound of the abdomen to assess for any interval growth.  If the aneurysms are difficult to see with ultrasound, we would move forward with CT scan.  While I think the rupture risk is very low, we discussed the signs and symptoms.  She was asked to call 9 1 immediately should any occur.   Victorino Sparrow, MD [facility] [phone]

## 2023-05-01 ENCOUNTER — Other Ambulatory Visit: Payer: Self-pay

## 2023-05-01 DIAGNOSIS — I728 Aneurysm of other specified arteries: Secondary | ICD-10-CM

## 2023-05-05 DIAGNOSIS — H52209 Unspecified astigmatism, unspecified eye: Secondary | ICD-10-CM | POA: Diagnosis not present

## 2023-05-05 DIAGNOSIS — H5203 Hypermetropia, bilateral: Secondary | ICD-10-CM | POA: Diagnosis not present

## 2023-05-05 DIAGNOSIS — H524 Presbyopia: Secondary | ICD-10-CM | POA: Diagnosis not present

## 2023-05-20 ENCOUNTER — Encounter: Payer: Medicare HMO | Admitting: Vascular Surgery

## 2023-06-12 DIAGNOSIS — J019 Acute sinusitis, unspecified: Secondary | ICD-10-CM | POA: Diagnosis not present

## 2023-06-12 DIAGNOSIS — R5383 Other fatigue: Secondary | ICD-10-CM | POA: Diagnosis not present

## 2023-06-12 DIAGNOSIS — J209 Acute bronchitis, unspecified: Secondary | ICD-10-CM | POA: Diagnosis not present

## 2023-06-12 DIAGNOSIS — Z1152 Encounter for screening for COVID-19: Secondary | ICD-10-CM | POA: Diagnosis not present

## 2023-06-12 DIAGNOSIS — J029 Acute pharyngitis, unspecified: Secondary | ICD-10-CM | POA: Diagnosis not present

## 2023-06-12 DIAGNOSIS — R051 Acute cough: Secondary | ICD-10-CM | POA: Diagnosis not present

## 2023-07-28 DIAGNOSIS — M25552 Pain in left hip: Secondary | ICD-10-CM | POA: Diagnosis not present

## 2023-07-28 DIAGNOSIS — M545 Low back pain, unspecified: Secondary | ICD-10-CM | POA: Diagnosis not present

## 2023-08-18 DIAGNOSIS — M51369 Other intervertebral disc degeneration, lumbar region without mention of lumbar back pain or lower extremity pain: Secondary | ICD-10-CM | POA: Diagnosis not present

## 2023-08-18 DIAGNOSIS — M25552 Pain in left hip: Secondary | ICD-10-CM | POA: Diagnosis not present

## 2023-08-18 DIAGNOSIS — M545 Low back pain, unspecified: Secondary | ICD-10-CM | POA: Diagnosis not present

## 2023-08-26 DIAGNOSIS — Z79899 Other long term (current) drug therapy: Secondary | ICD-10-CM | POA: Diagnosis not present

## 2023-08-26 DIAGNOSIS — M810 Age-related osteoporosis without current pathological fracture: Secondary | ICD-10-CM | POA: Diagnosis not present

## 2023-08-26 DIAGNOSIS — E7849 Other hyperlipidemia: Secondary | ICD-10-CM | POA: Diagnosis not present

## 2023-08-26 DIAGNOSIS — E785 Hyperlipidemia, unspecified: Secondary | ICD-10-CM | POA: Diagnosis not present

## 2023-09-02 DIAGNOSIS — E785 Hyperlipidemia, unspecified: Secondary | ICD-10-CM | POA: Diagnosis not present

## 2023-09-02 DIAGNOSIS — Z0000 Encounter for general adult medical examination without abnormal findings: Secondary | ICD-10-CM | POA: Diagnosis not present

## 2023-09-02 DIAGNOSIS — G629 Polyneuropathy, unspecified: Secondary | ICD-10-CM | POA: Diagnosis not present

## 2023-09-02 DIAGNOSIS — M545 Low back pain, unspecified: Secondary | ICD-10-CM | POA: Diagnosis not present

## 2023-09-02 DIAGNOSIS — R21 Rash and other nonspecific skin eruption: Secondary | ICD-10-CM | POA: Diagnosis not present

## 2023-09-02 DIAGNOSIS — R1319 Other dysphagia: Secondary | ICD-10-CM | POA: Diagnosis not present

## 2023-09-02 DIAGNOSIS — Z1339 Encounter for screening examination for other mental health and behavioral disorders: Secondary | ICD-10-CM | POA: Diagnosis not present

## 2023-09-02 DIAGNOSIS — Z1331 Encounter for screening for depression: Secondary | ICD-10-CM | POA: Diagnosis not present

## 2023-09-02 DIAGNOSIS — R682 Dry mouth, unspecified: Secondary | ICD-10-CM | POA: Diagnosis not present

## 2023-09-02 DIAGNOSIS — M810 Age-related osteoporosis without current pathological fracture: Secondary | ICD-10-CM | POA: Diagnosis not present

## 2023-09-02 DIAGNOSIS — I2584 Coronary atherosclerosis due to calcified coronary lesion: Secondary | ICD-10-CM | POA: Diagnosis not present

## 2023-09-11 DIAGNOSIS — M810 Age-related osteoporosis without current pathological fracture: Secondary | ICD-10-CM | POA: Diagnosis not present

## 2023-10-15 DIAGNOSIS — I2584 Coronary atherosclerosis due to calcified coronary lesion: Secondary | ICD-10-CM | POA: Diagnosis not present

## 2023-10-15 DIAGNOSIS — M810 Age-related osteoporosis without current pathological fracture: Secondary | ICD-10-CM | POA: Diagnosis not present

## 2023-11-21 ENCOUNTER — Other Ambulatory Visit: Payer: Self-pay | Admitting: Internal Medicine

## 2023-11-21 DIAGNOSIS — Z1231 Encounter for screening mammogram for malignant neoplasm of breast: Secondary | ICD-10-CM

## 2023-12-23 DIAGNOSIS — M79642 Pain in left hand: Secondary | ICD-10-CM | POA: Diagnosis not present

## 2023-12-23 DIAGNOSIS — M654 Radial styloid tenosynovitis [de Quervain]: Secondary | ICD-10-CM | POA: Diagnosis not present

## 2023-12-29 ENCOUNTER — Ambulatory Visit
Admission: RE | Admit: 2023-12-29 | Discharge: 2023-12-29 | Disposition: A | Discharge status: Home / Self Care | Source: Ambulatory Visit

## 2023-12-29 DIAGNOSIS — Z1231 Encounter for screening mammogram for malignant neoplasm of breast: Secondary | ICD-10-CM

## 2023-12-29 HISTORY — DX: Encounter for nonprocreative screening for genetic disease carrier status: Z13.71

## 2024-01-22 DIAGNOSIS — H04123 Dry eye syndrome of bilateral lacrimal glands: Secondary | ICD-10-CM | POA: Diagnosis not present

## 2024-01-22 DIAGNOSIS — H524 Presbyopia: Secondary | ICD-10-CM | POA: Diagnosis not present

## 2024-01-22 DIAGNOSIS — H2513 Age-related nuclear cataract, bilateral: Secondary | ICD-10-CM | POA: Diagnosis not present

## 2024-01-30 DIAGNOSIS — M654 Radial styloid tenosynovitis [de Quervain]: Secondary | ICD-10-CM | POA: Diagnosis not present

## 2024-02-23 DIAGNOSIS — M79641 Pain in right hand: Secondary | ICD-10-CM | POA: Diagnosis not present

## 2024-02-23 DIAGNOSIS — D8989 Other specified disorders involving the immune mechanism, not elsewhere classified: Secondary | ICD-10-CM | POA: Diagnosis not present

## 2024-02-23 DIAGNOSIS — M79642 Pain in left hand: Secondary | ICD-10-CM | POA: Diagnosis not present

## 2024-02-23 DIAGNOSIS — R682 Dry mouth, unspecified: Secondary | ICD-10-CM | POA: Diagnosis not present

## 2024-02-23 DIAGNOSIS — L299 Pruritus, unspecified: Secondary | ICD-10-CM | POA: Diagnosis not present

## 2024-02-23 DIAGNOSIS — M65841 Other synovitis and tenosynovitis, right hand: Secondary | ICD-10-CM | POA: Diagnosis not present

## 2024-02-23 DIAGNOSIS — I7300 Raynaud's syndrome without gangrene: Secondary | ICD-10-CM | POA: Diagnosis not present

## 2024-02-23 DIAGNOSIS — R21 Rash and other nonspecific skin eruption: Secondary | ICD-10-CM | POA: Diagnosis not present

## 2024-02-23 DIAGNOSIS — Z681 Body mass index (BMI) 19 or less, adult: Secondary | ICD-10-CM | POA: Diagnosis not present

## 2024-02-23 DIAGNOSIS — H04123 Dry eye syndrome of bilateral lacrimal glands: Secondary | ICD-10-CM | POA: Diagnosis not present

## 2024-03-09 DIAGNOSIS — M79642 Pain in left hand: Secondary | ICD-10-CM | POA: Diagnosis not present

## 2024-03-09 DIAGNOSIS — M79641 Pain in right hand: Secondary | ICD-10-CM | POA: Diagnosis not present

## 2024-03-09 DIAGNOSIS — H04123 Dry eye syndrome of bilateral lacrimal glands: Secondary | ICD-10-CM | POA: Diagnosis not present

## 2024-03-09 DIAGNOSIS — Z681 Body mass index (BMI) 19 or less, adult: Secondary | ICD-10-CM | POA: Diagnosis not present

## 2024-03-09 DIAGNOSIS — I7300 Raynaud's syndrome without gangrene: Secondary | ICD-10-CM | POA: Diagnosis not present

## 2024-03-09 DIAGNOSIS — L299 Pruritus, unspecified: Secondary | ICD-10-CM | POA: Diagnosis not present

## 2024-03-09 DIAGNOSIS — R21 Rash and other nonspecific skin eruption: Secondary | ICD-10-CM | POA: Diagnosis not present

## 2024-03-09 DIAGNOSIS — R682 Dry mouth, unspecified: Secondary | ICD-10-CM | POA: Diagnosis not present

## 2024-04-19 DIAGNOSIS — H04123 Dry eye syndrome of bilateral lacrimal glands: Secondary | ICD-10-CM | POA: Diagnosis not present

## 2024-04-19 DIAGNOSIS — H2513 Age-related nuclear cataract, bilateral: Secondary | ICD-10-CM | POA: Diagnosis not present

## 2024-04-26 ENCOUNTER — Other Ambulatory Visit: Payer: Self-pay | Admitting: Vascular Surgery

## 2024-04-26 DIAGNOSIS — I728 Aneurysm of other specified arteries: Secondary | ICD-10-CM

## 2024-05-11 DIAGNOSIS — M654 Radial styloid tenosynovitis [de Quervain]: Secondary | ICD-10-CM | POA: Diagnosis not present

## 2024-05-20 DIAGNOSIS — M654 Radial styloid tenosynovitis [de Quervain]: Secondary | ICD-10-CM | POA: Diagnosis not present
